# Patient Record
Sex: Male | Born: 2000 | Race: Black or African American | Hispanic: No | Marital: Single
Health system: Southern US, Academic
[De-identification: ages and names within clinical notes are randomized; demographics above are authoritative.]

## PROBLEM LIST (undated history)

## (undated) ENCOUNTER — Emergency Department (HOSPITAL_COMMUNITY): Discharge status: Absent without leave | Payer: MEDICAID

## (undated) ENCOUNTER — Telehealth: Attending: Mental Health | Primary: Mental Health

## (undated) ENCOUNTER — Ambulatory Visit

## (undated) VITALS — BP 117/76 | HR 121 | Temp 98.0°F | Resp 16 | Ht 64.0 in | Wt 160.0 lb

## (undated) DIAGNOSIS — F209 Schizophrenia, unspecified: Secondary | ICD-10-CM

---
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
## 2019-10-19 ENCOUNTER — Emergency Department (HOSPITAL_COMMUNITY)
Admission: EM | Admit: 2019-10-19 | Discharge: 2019-10-20 | Disposition: A | Discharge status: Home / Self Care | Payer: Medicaid Other | Attending: Emergency Medicine | Admitting: Emergency Medicine

## 2019-10-19 ENCOUNTER — Other Ambulatory Visit: Payer: Self-pay

## 2019-10-19 ENCOUNTER — Encounter (HOSPITAL_COMMUNITY): Payer: Self-pay | Admitting: Emergency Medicine

## 2019-10-19 DIAGNOSIS — Z79899 Other long term (current) drug therapy: Secondary | ICD-10-CM | POA: Insufficient documentation

## 2019-10-19 DIAGNOSIS — F339 Major depressive disorder, recurrent, unspecified: Secondary | ICD-10-CM | POA: Insufficient documentation

## 2019-10-19 DIAGNOSIS — F329 Major depressive disorder, single episode, unspecified: Secondary | ICD-10-CM | POA: Diagnosis present

## 2019-10-19 DIAGNOSIS — Z20822 Contact with and (suspected) exposure to covid-19: Secondary | ICD-10-CM | POA: Insufficient documentation

## 2019-10-19 DIAGNOSIS — Z87891 Personal history of nicotine dependence: Secondary | ICD-10-CM | POA: Diagnosis not present

## 2019-10-19 DIAGNOSIS — R45851 Suicidal ideations: Secondary | ICD-10-CM | POA: Insufficient documentation

## 2019-10-19 DIAGNOSIS — F209 Schizophrenia, unspecified: Secondary | ICD-10-CM | POA: Insufficient documentation

## 2019-10-19 LAB — CBC
HCT: 40.6 % (ref 39.0–52.0)
Hemoglobin: 13.8 g/dL (ref 13.0–17.0)
MCH: 32 pg (ref 26.0–34.0)
MCHC: 34 g/dL (ref 30.0–36.0)
MCV: 94.2 fL (ref 80.0–100.0)
Platelets: 224 10*3/uL (ref 150–400)
RBC: 4.31 MIL/uL (ref 4.22–5.81)
RDW: 11.8 % (ref 11.5–15.5)
WBC: 7.5 10*3/uL (ref 4.0–10.5)
nRBC: 0 % (ref 0.0–0.2)

## 2019-10-19 LAB — RAPID URINE DRUG SCREEN, HOSP PERFORMED
Amphetamines: NOT DETECTED
Barbiturates: NOT DETECTED
Benzodiazepines: NOT DETECTED
Cocaine: NOT DETECTED
Opiates: NOT DETECTED
Tetrahydrocannabinol: POSITIVE — AB

## 2019-10-19 LAB — COMPREHENSIVE METABOLIC PANEL
ALT: 13 U/L (ref 0–44)
AST: 15 U/L (ref 15–41)
Albumin: 4.5 g/dL (ref 3.5–5.0)
Alkaline Phosphatase: 69 U/L (ref 38–126)
Anion gap: 10 (ref 5–15)
BUN: 7 mg/dL (ref 6–20)
CO2: 24 mmol/L (ref 22–32)
Calcium: 9.3 mg/dL (ref 8.9–10.3)
Chloride: 105 mmol/L (ref 98–111)
Creatinine, Ser: 1.06 mg/dL (ref 0.61–1.24)
GFR calc Af Amer: >60 mL/min (ref >60)
GFR calc non Af Amer: >60 mL/min (ref >60)
Glucose, Bld: 90 mg/dL (ref 70–99)
Potassium: 3.9 mmol/L (ref 3.5–5.1)
Sodium: 139 mmol/L (ref 135–145)
Total Bilirubin: 0.8 mg/dL (ref 0.3–1.2)
Total Protein: 7 g/dL (ref 6.5–8.1)

## 2019-10-19 LAB — ETHANOL: Alcohol, Ethyl (B): <10 mg/dL (ref <10)

## 2019-10-19 LAB — ACETAMINOPHEN LEVEL: Acetaminophen (Tylenol), Serum: <10 ug/mL — ABNORMAL LOW (ref 10–30)

## 2019-10-19 LAB — SALICYLATE LEVEL: Salicylate Lvl: <7 mg/dL — ABNORMAL LOW (ref 7.0–30.0)

## 2019-10-19 MED ORDER — HYDROXYZINE HCL 50 MG PO TABS
50.0000 mg | ORAL_TABLET | Freq: Four times a day (QID) | ORAL | Status: DC | PRN
  Administered 2019-10-20: 50 mg via ORAL
  Filled 2019-10-19: qty 1

## 2019-10-19 MED ORDER — RISPERIDONE 3 MG PO TABS
3.0000 mg | ORAL_TABLET | Freq: Two times a day (BID) | ORAL | Status: DC
  Administered 2019-10-20: 3 mg via ORAL
  Filled 2019-10-19 (×2): qty 1

## 2019-10-19 MED ORDER — TRAZODONE HCL 50 MG PO TABS
50.0000 mg | ORAL_TABLET | Freq: Every evening | ORAL | Status: DC | PRN
  Administered 2019-10-20: 50 mg via ORAL
  Filled 2019-10-19: qty 1

## 2019-10-19 MED ORDER — BENZTROPINE MESYLATE 1 MG PO TABS
0.5000 mg | ORAL_TABLET | Freq: Two times a day (BID) | ORAL | Status: DC
  Administered 2019-10-20: 01:00:00 0.5 mg via ORAL
  Filled 2019-10-19: qty 1

## 2019-10-19 NOTE — ED Triage Notes (Signed)
Pt brought to ED by [facility] with c/o feeling suicidal.  Pt st's his meds are not helping anymore.  Pt takes Trazodone and Risperdone.

## 2019-10-19 NOTE — ED Provider Notes (Signed)
[facility] EMERGENCY DEPARTMENT Provider Note   CSN: [id number] Arrival date & time: 10/19/19  2006     History Chief Complaint  Patient presents with  . Suicidal    Alec Williams is a 19 y.o. male.  Patient states that he is suicidal.  Patient with history of schizophrenia.  Patient is not having hallucinations now  The history is provided by the patient. No language interpreter was used.  Altered Mental Status Presenting symptoms: behavior changes   Severity:  Severe Most recent episode:  More than 2 days ago Episode history:  Multiple Timing:  Constant Progression:  Worsening Chronicity:  Recurrent Context: drug use   Context: not alcohol use   Associated symptoms: no abdominal pain, no hallucinations, no headaches, no rash and no seizures        Past Medical History:  Diagnosis Date  . Schizophrenia [facility])     Patient Active Problem List   Diagnosis Date Noted  . Self-injurious behavior 12/09/2017  . Major depressive disorder, recurrent, severe with psychotic features ([place]) 12/08/2017    History reviewed. No pertinent surgical history.     No family history on file.  Social History   Tobacco Use  . Smoking status: Former Research scientist (life sciences)  . Smokeless tobacco: Former Systems developer  . Tobacco comment: "I smoke black and mild"  Substance Use Topics  . Alcohol use: No  . Drug use: No    Home Medications Prior to Admission medications   Medication Sig Start Date End Date Taking? Authorizing Provider  risperiDONE (RISPERDAL) 3 MG tablet Take 1 tablet (3 mg total) by mouth 2 (two) times daily. For mood control 07/16/18  Yes Lindell Spar I, NP  benztropine (COGENTIN) 0.5 MG tablet Take 1 tablet (0.5 mg total) by mouth 2 (two) times daily. For prevention of drug induced tremors Patient not taking: Reported on 10/19/2019 07/16/18   Lindell Spar I, NP  hydrOXYzine (ATARAX/VISTARIL) 50 MG tablet Take 1 tablet (50 mg total) by mouth every 6 (six) hours as  needed for anxiety. Patient not taking: Reported on 10/19/2019 07/16/18   Lindell Spar I, NP  traZODone (DESYREL) 50 MG tablet Take 1 tablet (50 mg total) by mouth at bedtime as needed for sleep. Patient not taking: Reported on 10/19/2019 07/16/18   Lindell Spar I, NP    Allergies    Patient has no known allergies.  Review of Systems   Review of Systems  Constitutional: Negative for appetite change and fatigue.  HENT: Negative for congestion, ear discharge and sinus pressure.   Eyes: Negative for discharge.  Respiratory: Negative for cough.   Cardiovascular: Negative for chest pain.  Gastrointestinal: Negative for abdominal pain and diarrhea.  Genitourinary: Negative for frequency and hematuria.  Musculoskeletal: Negative for back pain.  Skin: Negative for rash.  Neurological: Negative for seizures and headaches.  Psychiatric/Behavioral: Positive for suicidal ideas. Negative for hallucinations.    Physical Exam Updated Vital Signs BP 105/67 (BP Location: Left Arm)   Pulse 65   Temp 98.7 F (37.1 C) (Oral)   Resp 16   Ht 5\' 8"  (1.727 m)   Wt 59 kg   SpO2 96%   BMI 19.77 kg/m   Physical Exam Vitals and nursing note reviewed.  Constitutional:      Appearance: He is well-developed.  HENT:     Head: Normocephalic.     Nose: Nose normal.  Eyes:     General: No scleral icterus.    Conjunctiva/sclera: Conjunctivae normal.  Neck:  Thyroid: No thyromegaly.  Cardiovascular:     Rate and Rhythm: Normal rate and regular rhythm.     Heart sounds: No murmur. No friction rub. No gallop.   Pulmonary:     Breath sounds: No stridor. No wheezing or rales.  Chest:     Chest wall: No tenderness.  Abdominal:     General: There is no distension.     Tenderness: There is no abdominal tenderness. There is no rebound.  Musculoskeletal:        General: Normal range of motion.     Cervical back: Neck supple.  Lymphadenopathy:     Cervical: No cervical adenopathy.  Skin:    Findings:  No erythema or rash.  Neurological:     Mental Status: He is alert and oriented to person, place, and time.     Motor: No abnormal muscle tone.     Coordination: Coordination normal.  Psychiatric:     Comments: Suicidal     ED Results / Procedures / Treatments   Labs (all labs ordered are listed, but only abnormal results are displayed) Labs Reviewed  RAPID URINE DRUG SCREEN, HOSP PERFORMED - Abnormal; Notable for the following components:      Result Value   Tetrahydrocannabinol POSITIVE (*)    All other components within normal limits  RESPIRATORY PANEL BY RT PCR (FLU A&B, COVID)  CBC  COMPREHENSIVE METABOLIC PANEL  ETHANOL  SALICYLATE LEVEL  ACETAMINOPHEN LEVEL    EKG None  Radiology No results found.  Procedures Procedures (including critical care time)  Medications Ordered in ED Medications  hydrOXYzine (ATARAX/VISTARIL) tablet 50 mg (has no administration in time range)  benztropine (COGENTIN) tablet 0.5 mg (has no administration in time range)  traZODone (DESYREL) tablet 50 mg (has no administration in time range)  risperiDONE (RISPERDAL) tablet 3 mg (has no administration in time range)    ED Course  I have reviewed the triage vital signs and the nursing notes.  Pertinent labs & imaging results that were available during my care of the patient were reviewed by me and considered in my medical decision making (see chart for details).    MDM Rules/Calculators/A&P                     Patient is suicidal and will be seen by behavioral health Final Clinical Impression(s) / ED Diagnoses Final diagnoses:  None    Rx / DC Orders ED Discharge Orders    None       Bethann Berkshire, MD 10/19/19 2240

## 2019-10-20 ENCOUNTER — Other Ambulatory Visit: Payer: Self-pay

## 2019-10-20 LAB — RESPIRATORY PANEL BY RT PCR (FLU A&B, COVID)
Influenza A by PCR: NEGATIVE
Influenza B by PCR: NEGATIVE
SARS Coronavirus 2 by RT PCR: NEGATIVE

## 2019-10-20 NOTE — ED Notes (Signed)
Pt ate 100% of breakfast. Belongings returned to pt. Pt contacted mother to pick him up for discharge.

## 2019-10-20 NOTE — ED Notes (Signed)
SI/VOL  Breakfast ordered

## 2019-10-20 NOTE — ED Notes (Signed)
TTS in progress 

## 2019-10-20 NOTE — ED Notes (Signed)
Pt to be dc'd home in the am per [place] with [facility].  Pt to follow up with his ACT team and [facility] for medication and counseling.

## 2019-10-20 NOTE — ED Notes (Signed)
Patient denies pain and is resting comfortably.  

## 2019-10-20 NOTE — ED Notes (Signed)
Pt stated mother was on her way to pick him up. Vital signs obtain, discharge paperwork reviewed. Resources for outpatient services included in AVS given to patient. Patient verbalized understanding of discharge instructions, no further questions for staff. Pt was instructed to alert RN when mother is present for pickup and staff will accompany pt out for discharge.
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
## 2019-12-05 ENCOUNTER — Emergency Department (HOSPITAL_COMMUNITY)
Admission: EM | Admit: 2019-12-05 | Discharge: 2019-12-05 | Disposition: A | Discharge status: Home / Self Care | Payer: Medicaid Other | Attending: Emergency Medicine | Admitting: Emergency Medicine

## 2019-12-05 ENCOUNTER — Encounter (HOSPITAL_COMMUNITY): Payer: Self-pay

## 2019-12-05 ENCOUNTER — Other Ambulatory Visit: Payer: Self-pay

## 2019-12-05 DIAGNOSIS — Y939 Activity, unspecified: Secondary | ICD-10-CM | POA: Diagnosis not present

## 2019-12-05 DIAGNOSIS — R519 Headache, unspecified: Secondary | ICD-10-CM | POA: Insufficient documentation

## 2019-12-05 DIAGNOSIS — Y929 Unspecified place or not applicable: Secondary | ICD-10-CM | POA: Insufficient documentation

## 2019-12-05 DIAGNOSIS — R0789 Other chest pain: Secondary | ICD-10-CM | POA: Diagnosis not present

## 2019-12-05 DIAGNOSIS — Z87891 Personal history of nicotine dependence: Secondary | ICD-10-CM | POA: Diagnosis not present

## 2019-12-05 DIAGNOSIS — Y999 Unspecified external cause status: Secondary | ICD-10-CM | POA: Diagnosis not present

## 2019-12-05 DIAGNOSIS — Z79899 Other long term (current) drug therapy: Secondary | ICD-10-CM | POA: Diagnosis not present
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
## 2020-03-01 ENCOUNTER — Encounter (HOSPITAL_COMMUNITY): Payer: Self-pay | Admitting: Emergency Medicine

## 2020-03-01 ENCOUNTER — Emergency Department (HOSPITAL_COMMUNITY)
Admission: EM | Admit: 2020-03-01 | Discharge: 2020-03-01 | Disposition: A | Discharge status: Home / Self Care | Payer: Medicaid Other | Attending: Emergency Medicine | Admitting: Emergency Medicine

## 2020-03-01 DIAGNOSIS — N341 Nonspecific urethritis: Secondary | ICD-10-CM | POA: Insufficient documentation

## 2020-03-01 DIAGNOSIS — H60333 Swimmer's ear, bilateral: Secondary | ICD-10-CM

## 2020-03-01 DIAGNOSIS — R300 Dysuria: Secondary | ICD-10-CM | POA: Diagnosis present

## 2020-03-01 DIAGNOSIS — N342 Other urethritis: Secondary | ICD-10-CM

## 2020-03-01 DIAGNOSIS — Z87891 Personal history of nicotine dependence: Secondary | ICD-10-CM | POA: Insufficient documentation

## 2020-03-01 DIAGNOSIS — F209 Schizophrenia, unspecified: Secondary | ICD-10-CM | POA: Insufficient documentation

## 2020-03-01 LAB — URINALYSIS, ROUTINE W REFLEX MICROSCOPIC
Bilirubin Urine: NEGATIVE
Glucose, UA: NEGATIVE mg/dL
Hgb urine dipstick: NEGATIVE
Ketones, ur: NEGATIVE mg/dL
Leukocytes,Ua: NEGATIVE
Nitrite: NEGATIVE
Protein, ur: NEGATIVE mg/dL
Specific Gravity, Urine: 1.021 (ref 1.005–1.030)
pH: 7 (ref 5.0–8.0)

## 2020-03-01 MED ORDER — NEOMYCIN-POLYMYXIN-HC 3.5-10000-1 OT SUSP: 4.0000 [drp] | Freq: Three times a day (TID) | OTIC | 0 refills | Status: DC

## 2020-03-01 NOTE — ED Provider Notes (Signed)
[facility]-EMERGENCY DEPT Provider Note   CSN: [id number] Arrival date & time: 03/01/20  1731     History Chief Complaint  Patient presents with  . Dysuria    Alec Williams is a 19 y.o. male.  HPI 19 year old male with a history of schizophrenia presents to the ER with complaints of pain with urination and bladder pain.  He states that this has been bothering for the last few weeks.  He called his primary care doctor and states that they were concerned that he might have a UTI.  He has had no difficulty voiding, but states that sometimes his bladder is painful and he has some pain on urination.  No urgency or decreased urine flow.  He has not noticed any blood in his urine.  He does not have any penile discharge.  He states that he sometimes he feels like "his kidneys hurt".  He denies being sexually active.  He also complains of bilateral ear pain, states he went swimming approximately 2 weeks ago and they have been hurting him since.  Denies any fevers, chills, abdominal pain, nausea, vomiting.  No history of kidney stones.    Past Medical History:  Diagnosis Date  . Schizophrenia [facility])     Patient Active Problem List   Diagnosis Date Noted  . Self-injurious behavior 12/09/2017  . 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 No pertinent surgical history.     No family history on file.  Social History   Tobacco Use  . Smoking status: Former Games developer  . Smokeless tobacco: Former Neurosurgeon  . Tobacco comment: "I smoke black and mild"  Vaping Use  . Vaping Use: Every day  Substance Use Topics  . Alcohol use: No  . Drug use: No    Home Medications Prior to Admission medications   Medication Sig Start Date End Date Taking? Authorizing Provider  benztropine (COGENTIN) 0.5 MG tablet Take 1 tablet (0.5 mg total) by mouth 2 (two) times daily. For prevention of drug induced tremors Patient  not taking: Reported on 10/19/2019 07/16/18   Armandina Stammer I, NP  hydrOXYzine (ATARAX/VISTARIL) 50 MG tablet Take 1 tablet (50 mg total) by mouth every 6 (six) hours as needed for anxiety. Patient not taking: Reported on 10/19/2019 07/16/18   Armandina Stammer I, NP  neomycin-polymyxin-hydrocortisone (CORTISPORIN) 3.5-10000-1 OTIC suspension Place 4 drops into both ears 3 (three) times daily. 03/01/20   Mare Ferrari, PA-C  risperiDONE (RISPERDAL) 3 MG tablet Take 1 tablet (3 mg total) by mouth 2 (two) times daily. For mood control 07/16/18   Armandina Stammer I, NP  traZODone (DESYREL) 50 MG tablet Take 1 tablet (50 mg total) by mouth at bedtime as needed for sleep. Patient not taking: Reported on 10/19/2019 07/16/18   Armandina Stammer I, NP    Allergies    Patient has no known allergies.  Review of Systems   Review of Systems  Constitutional: Negative for chills and fever.  HENT: Positive for ear pain. Negative for ear discharge, facial swelling, sore throat, trouble swallowing and voice change.   Gastrointestinal: Negative for abdominal pain, nausea and vomiting.  Genitourinary: Positive for dysuria. Negative for decreased urine volume, discharge, flank pain, penile pain, penile swelling, testicular pain and urgency.    Physical Exam Updated Vital Signs BP 111/79 (BP Location: Right Arm)   Pulse 89   Temp 98.9 F (37.2 C) (Oral)   Resp 19   SpO2 100%  Physical Exam Vitals and nursing note reviewed.  Constitutional:      General: He is not in acute distress.    Appearance: Normal appearance. He is well-developed. He is not ill-appearing, toxic-appearing or diaphoretic.  HENT:     Head: Normocephalic and atraumatic.     Right Ear: Tympanic membrane normal. There is no impacted cerumen.     Left Ear: Tympanic membrane normal. There is no impacted cerumen.     Ears:     Comments: Pain with palpation to the tragus bilaterally.  Mildly inflamed ear canal, TM intact, nonerythematous, nonbulging.     Mouth/Throat:     Mouth: Mucous membranes are moist.     Pharynx: Oropharynx is clear.  Eyes:     Conjunctiva/sclera: Conjunctivae normal.  Cardiovascular:     Rate and Rhythm: Normal rate and regular rhythm.     Pulses: Normal pulses.     Heart sounds: Normal heart sounds. No murmur heard.   Pulmonary:     Effort: Pulmonary effort is normal. No respiratory distress.     Breath sounds: Normal breath sounds.  Abdominal:     Palpations: Abdomen is soft.     Tenderness: There is no abdominal tenderness. There is no right CVA tenderness or left CVA tenderness.     Comments: Abdomen soft and nontender, no flank tenderness bilaterally  Musculoskeletal:        General: No tenderness or deformity. Normal range of motion.     Cervical back: Neck supple.  Skin:    General: Skin is warm and dry.     Findings: No bruising, erythema or rash.  Neurological:     General: No focal deficit present.     Mental Status: He is alert and oriented to person, place, and time.  Psychiatric:        Mood and Affect: Mood normal.        Behavior: Behavior normal.     ED Results / Procedures / Treatments   Labs (all labs ordered are listed, but only abnormal results are displayed) Labs Reviewed  URINALYSIS, ROUTINE W REFLEX MICROSCOPIC - Abnormal; Notable for the following components:      Result Value   APPearance CLOUDY (*)    All other components within normal limits  URINE CULTURE  GC/CHLAMYDIA PROBE AMP ([place]) NOT AT [facility]    EKG None  Radiology No results found.  Procedures Procedures (including critical care time)  Medications Ordered in ED Medications - No data to display  ED Course  I have reviewed the triage vital signs and the nursing notes.  Pertinent labs & imaging results that were available during my care of the patient were reviewed by me and considered in my medical decision making (see chart for details).    MDM Rules/Calculators/A&P                           19 year old with painful urination and pain in the bladder occasionally.  He also complains of bilateral ear pain.  Physical exam without abdominal tenderness, flank tenderness.  Ear exam with pain with palpation to the tragus bilaterally, mildly erythematous ear canal, but nonerythematous TM, which is still intact.  History of diabetes, no headaches, no purulent otorrhea, no dysphagia, hoarseness or facial nerve dysfunction.  Suspect otitis externa. Normal outer ear without swelling, doubt malignant otitis externa.  UA without evidence of UTI, no blood.  Given benign physical exam, and no blood in  the urine, doubt kidney stones.  No evidence of UTI.  Will add on GC chlamydia but patient states that he is not sexually active.  Culture pending.  In terms of his ear pain, will treat with polymyxin drops.  Patient states that he does not have a PCP, encouraged him to call the phone number on the discharge paperwork in order to establish with  One and follow up.  Also referred him to urology if his symptoms do not improve.  Patient is overall reassured, he voices understanding and is agreeable.  Return precautions given.  At this stage in the ED course, the patient is medically screened and is stable for discharge.   Discussed the case with Dr. Juleen China who is agreable with the above disposition and plan. Final Clinical Impression(s) / ED Diagnoses Final diagnoses:  Acute swimmer's ear of both sides  Urethritis    Rx / DC Orders ED Discharge Orders         Ordered    neomycin-polymyxin-hydrocortisone (CORTISPORIN) 3.5-10000-1 OTIC suspension  3 times daily     Discontinue  Reprint     03/01/20 1939           Leone Brand 03/01/20 1956    Raeford Razor, MD 03/03/20 2306

## 2020-03-01 NOTE — Discharge Instructions (Addendum)
Please make sure to follow-up with a primary care provider.  If you do not have 1, please call the number on your discharge paperwork in order to establish with 1.  I have also provided the phone number for a urologist who is a bladder doctor, he may call them to schedule an appointment if your symptoms not improve.  Your STD results will be visible via the MyChart app which you can download.  If this is positive, you will receive a phone call from the health department for treatment.  I have prescribed an antibiotic for your ears, please take them as directed.  Return to the ER if your symptoms worsen.

## 2020-03-01 NOTE — ED Triage Notes (Signed)
Patient here from home via EMS reporting UTI. Reports PCP called and notified him. Reports burning when urinating.

## 2020-03-02 LAB — GC/CHLAMYDIA PROBE AMP (~~LOC~~) NOT AT ARMC
Chlamydia: NEGATIVE
Comment: NEGATIVE
Comment: NORMAL
Neisseria Gonorrhea: NEGATIVE

## 2020-03-03 LAB — URINE CULTURE: Culture: NO GROWTH

## 2020-03-08 ENCOUNTER — Encounter (HOSPITAL_COMMUNITY): Payer: Self-pay

## 2020-03-08 ENCOUNTER — Emergency Department (HOSPITAL_COMMUNITY): Payer: Medicaid Other

## 2020-03-08 ENCOUNTER — Other Ambulatory Visit: Payer: Self-pay

## 2020-03-08 ENCOUNTER — Emergency Department (HOSPITAL_COMMUNITY)
Admission: EM | Admit: 2020-03-08 | Discharge: 2020-03-08 | Disposition: A | Discharge status: Home / Self Care | Payer: Medicaid Other | Attending: Emergency Medicine | Admitting: Emergency Medicine

## 2020-03-08 DIAGNOSIS — S0083XA Contusion of other part of head, initial encounter: Secondary | ICD-10-CM | POA: Diagnosis not present

## 2020-03-08 DIAGNOSIS — S0990XA Unspecified injury of head, initial encounter: Secondary | ICD-10-CM | POA: Insufficient documentation

## 2020-03-08 DIAGNOSIS — Z87891 Personal history of nicotine dependence: Secondary | ICD-10-CM | POA: Insufficient documentation

## 2020-03-08 DIAGNOSIS — X58XXXA Exposure to other specified factors, initial encounter: Secondary | ICD-10-CM | POA: Insufficient documentation

## 2020-03-08 DIAGNOSIS — Y9289 Other specified places as the place of occurrence of the external cause: Secondary | ICD-10-CM | POA: Diagnosis not present

## 2020-03-08 DIAGNOSIS — Y998 Other external cause status: Secondary | ICD-10-CM | POA: Diagnosis not present

## 2020-03-08 DIAGNOSIS — Y9389 Activity, other specified: Secondary | ICD-10-CM | POA: Insufficient documentation

## 2020-03-08 DIAGNOSIS — S0450XA Injury of facial nerve, unspecified side, initial encounter: Secondary | ICD-10-CM | POA: Diagnosis present

## 2020-03-08 NOTE — ED Notes (Signed)
Pt not visualized in room.
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
## 2022-01-09 ENCOUNTER — Ambulatory Visit
Admit: 2022-01-09 | Discharge: 2022-01-10 | Disposition: A | Discharge status: Other Acute Inpatient Hospital | Payer: MEDICAID

## 2022-01-09 DIAGNOSIS — T7421XA Adult sexual abuse, confirmed, initial encounter: Principal | ICD-10-CM

## 2022-01-09 MED ORDER — EMTRICITABINE 200 MG-TENOFOVIR DISOPROXIL FUMARATE 300 MG TABLET
ORAL_TABLET | Freq: Every day | ORAL | 0 refills | 25 days | Status: CP
Start: 2022-01-09 — End: 2022-02-03

## 2022-01-09 MED ORDER — RALTEGRAVIR 400 MG TABLET
ORAL_TABLET | Freq: Two times a day (BID) | ORAL | 0 refills | 25 days | Status: CP
Start: 2022-01-09 — End: 2022-02-03

## 2022-01-10 NOTE — Unmapped (Signed)
[facility] Specialty Medication Onboarding    Specialty Medication: emtricitabine-tenofovir (TDF) 200-300 mg per tablet (TRUVADA)  Prior Authorization: Not Required   Financial Assistance: No - copay  <$25  Final Copay/Day Supply: $4 / 30    Insurance Restrictions: None     Notes to Pharmacist:     The triage team has completed the benefits investigation and has determined that the patient is able to fill this medication at [facility]. Please contact the patient to complete the onboarding or follow up with the prescribing physician as needed.    [facility] Specialty Medication Onboarding    Specialty Medication: ISENTRESS 400 mg tablet (raltegravir)  Prior Authorization: Not Required   Financial Assistance: No - copay  <$25  Final Copay/Day Supply: $4 / 30    Insurance Restrictions: None     Notes to Pharmacist:     The triage team has completed the benefits investigation and has determined that the patient is able to fill this medication at [facility]. Please contact the patient to complete the onboarding or follow up with the prescribing physician as needed.
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
## 2022-01-13 LAB — HIV ANTIGEN/ANTIBODY COMBO: HIV ANTIGEN/ANTIBODY COMBO: NONREACTIVE

## 2022-01-13 LAB — HEPATITIS B SURFACE ANTIBODY
HEPATITIS B SURFACE ANTIBODY QUANT: <8 m[IU]/mL (ref <8.00)
HEPATITIS B SURFACE ANTIBODY: NONREACTIVE

## 2022-01-13 LAB — HEPATITIS C ANTIBODY: HEPATITIS C ANTIBODY: NONREACTIVE

## 2022-01-13 LAB — HEPATITIS B SURFACE ANTIGEN: HEPATITIS B SURFACE ANTIGEN: NONREACTIVE

## 2022-06-24 ENCOUNTER — Encounter (HOSPITAL_COMMUNITY): Payer: Self-pay | Admitting: Psychiatry

## 2022-06-24 ENCOUNTER — Other Ambulatory Visit: Payer: Self-pay

## 2022-06-24 ENCOUNTER — Inpatient Hospital Stay (HOSPITAL_COMMUNITY)
Admission: AD | Admit: 2022-06-24 | Discharge: 2022-07-02 | DRG: 885 | Disposition: A | Discharge status: Home / Self Care | Payer: Medicaid Other | Attending: Psychiatry | Admitting: Psychiatry

## 2022-06-24 DIAGNOSIS — Z9152 Personal history of nonsuicidal self-harm: Secondary | ICD-10-CM | POA: Diagnosis not present

## 2022-06-24 DIAGNOSIS — G4700 Insomnia, unspecified: Secondary | ICD-10-CM | POA: Diagnosis present

## 2022-06-24 DIAGNOSIS — Z79899 Other long term (current) drug therapy: Secondary | ICD-10-CM

## 2022-06-24 DIAGNOSIS — F259 Schizoaffective disorder, unspecified: Secondary | ICD-10-CM | POA: Diagnosis present

## 2022-06-24 DIAGNOSIS — F333 Major depressive disorder, recurrent, severe with psychotic symptoms: Principal | ICD-10-CM

## 2022-06-24 DIAGNOSIS — Z20822 Contact with and (suspected) exposure to covid-19: Secondary | ICD-10-CM | POA: Diagnosis present

## 2022-06-24 DIAGNOSIS — F909 Attention-deficit hyperactivity disorder, unspecified type: Secondary | ICD-10-CM | POA: Diagnosis present

## 2022-06-24 DIAGNOSIS — Z7289 Other problems related to lifestyle: Secondary | ICD-10-CM

## 2022-06-24 DIAGNOSIS — F17210 Nicotine dependence, cigarettes, uncomplicated: Secondary | ICD-10-CM | POA: Diagnosis present

## 2022-06-24 DIAGNOSIS — F064 Anxiety disorder due to known physiological condition: Secondary | ICD-10-CM | POA: Diagnosis present

## 2022-06-24 DIAGNOSIS — Z23 Encounter for immunization: Secondary | ICD-10-CM | POA: Diagnosis not present

## 2022-06-24 DIAGNOSIS — Z818 Family history of other mental and behavioral disorders: Secondary | ICD-10-CM | POA: Diagnosis not present

## 2022-06-24 DIAGNOSIS — F250 Schizoaffective disorder, bipolar type: Secondary | ICD-10-CM | POA: Diagnosis not present

## 2022-06-24 LAB — SARS CORONAVIRUS 2 BY RT PCR: SARS Coronavirus 2 by RT PCR: NEGATIVE

## 2022-06-24 MED ORDER — ACETAMINOPHEN 325 MG PO TABS
650.0000 mg | ORAL_TABLET | Freq: Four times a day (QID) | ORAL | Status: DC | PRN
  Administered 2022-06-26 – 2022-07-02 (×7): 650 mg via ORAL
  Filled 2022-06-24 (×7): qty 2

## 2022-06-24 MED ORDER — TRAZODONE HCL 50 MG PO TABS
50.0000 mg | ORAL_TABLET | Freq: Every evening | ORAL | Status: DC | PRN
  Administered 2022-06-24 – 2022-06-28 (×5): 50 mg via ORAL
  Filled 2022-06-24 (×5): qty 1

## 2022-06-24 MED ORDER — ALUM & MAG HYDROXIDE-SIMETH 200-200-20 MG/5ML PO SUSP: 30.0000 mL | ORAL | Status: DC | PRN

## 2022-06-24 MED ORDER — MAGNESIUM HYDROXIDE 400 MG/5ML PO SUSP
30.0000 mL | Freq: Every day | ORAL | Status: DC | PRN
  Administered 2022-06-28 – 2022-06-30 (×3): 30 mL via ORAL
  Filled 2022-06-24 (×3): qty 30

## 2022-06-24 MED ORDER — INFLUENZA VAC SPLIT QUAD 0.5 ML IM SUSY
0.5000 mL | PREFILLED_SYRINGE | INTRAMUSCULAR | Status: AC
  Administered 2022-06-25: 0.5 mL via INTRAMUSCULAR
  Filled 2022-06-24: qty 0.5

## 2022-06-24 MED ORDER — HYDROXYZINE HCL 25 MG PO TABS
25.0000 mg | ORAL_TABLET | Freq: Three times a day (TID) | ORAL | Status: DC | PRN
  Administered 2022-06-24 – 2022-07-01 (×9): 25 mg via ORAL
  Filled 2022-06-24 (×9): qty 1
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
## 2022-06-24 NOTE — Progress Notes (Addendum)
   06/24/22 2200  Psych Admission Type (Psych Patients Only)  Admission Status Voluntary  Psychosocial Assessment  Patient Complaints Anxiety  Eye Contact Intense  Facial Expression Fixed smile  Affect Euphoric  Speech Pressured  Interaction Attention-seeking  Motor Activity Hyperactive  Appearance/Hygiene Layered clothes  Behavior Characteristics Appropriate to situation  Mood Elated  Thought Process  Coherency Tangential  Content Magical thinking  Delusions Referential  Perception WDL  Hallucination None reported or observed  Judgment Impaired  Confusion None  Danger to Self  Current suicidal ideation? Denies  Danger to Others  Danger to Others None reported or observed   Pt has been calm and cooperative, pt has poor boundary and requires redirections. Pt given Trazodone and vistaril before bed, pt in bed at this time asleep, will continue to monitor.

## 2022-06-24 NOTE — Plan of Care (Signed)
Care Plan initiated with patient

## 2022-06-24 NOTE — Tx Team (Signed)
Initial Treatment Plan 06/24/2022 7:27 PM [name] [id number]    PATIENT STRESSORS: Legal issue     PATIENT STRENGTHS: Electronics engineer    PATIENT IDENTIFIED PROBLEMS: I have to go to court for stalking                     DISCHARGE CRITERIA:  Improved stabilization in mood, thinking, and/or behavior  PRELIMINARY DISCHARGE PLAN: Return to previous living arrangement  PATIENT/FAMILY INVOLVEMENT: This treatment plan has been presented to and reviewed with the patient, Alec Williams, and/or family member, .  The patient and family have been given the opportunity to ask questions and make suggestions.  Clarita Crane, RN 06/24/2022, 7:27 PM

## 2022-06-24 NOTE — [facility] Group Notes (Signed)
Adult Psychoeducational Group Note  Date:  06/24/2022 Time:  8:30 PM  Group Topic/Focus:  Wrap-Up Group:   The focus of this group is to help patients review their daily goal of treatment and discuss progress on daily workbooks.  Participation Level:  Active  Participation Quality:  Attentive  Affect:  Appropriate  Cognitive:  Alert  Insight: Appropriate  Engagement in Group:  Engaged  Modes of Intervention:  Discussion  Additional Comments:  Patient attended and participated in the [place] group.  Annie Sable 06/24/2022, 8:30 PM

## 2022-06-25 LAB — URINALYSIS, COMPLETE (UACMP) WITH MICROSCOPIC
Bacteria, UA: NONE SEEN
Bilirubin Urine: NEGATIVE
Glucose, UA: NEGATIVE mg/dL
Hgb urine dipstick: NEGATIVE
Ketones, ur: NEGATIVE mg/dL
Leukocytes,Ua: NEGATIVE
Nitrite: NEGATIVE
Protein, ur: NEGATIVE mg/dL
Specific Gravity, Urine: 1.01 (ref 1.005–1.030)
pH: 7 (ref 5.0–8.0)

## 2022-06-25 LAB — RAPID URINE DRUG SCREEN, HOSP PERFORMED
Amphetamines: NOT DETECTED
Barbiturates: NOT DETECTED
Benzodiazepines: NOT DETECTED
Cocaine: NOT DETECTED
Opiates: NOT DETECTED
Tetrahydrocannabinol: NOT DETECTED

## 2022-06-25 LAB — COMPREHENSIVE METABOLIC PANEL
ALT: 29 U/L (ref 0–44)
AST: 16 U/L (ref 15–41)
Albumin: 4 g/dL (ref 3.5–5.0)
Alkaline Phosphatase: 61 U/L (ref 38–126)
Anion gap: 7 (ref 5–15)
BUN: 8 mg/dL (ref 6–20)
CO2: 26 mmol/L (ref 22–32)
Calcium: 9.2 mg/dL (ref 8.9–10.3)
Chloride: 105 mmol/L (ref 98–111)
Creatinine, Ser: 0.74 mg/dL (ref 0.61–1.24)
GFR, Estimated: >60 mL/min (ref >60)
Glucose, Bld: 82 mg/dL (ref 70–99)
Potassium: 3.6 mmol/L (ref 3.5–5.1)
Sodium: 138 mmol/L (ref 135–145)
Total Bilirubin: 0.8 mg/dL (ref 0.3–1.2)
Total Protein: 6.8 g/dL (ref 6.5–8.1)

## 2022-06-25 LAB — CBC
HCT: 43.2 % (ref 39.0–52.0)
Hemoglobin: 14.5 g/dL (ref 13.0–17.0)
MCH: 32.3 pg (ref 26.0–34.0)
MCHC: 33.6 g/dL (ref 30.0–36.0)
MCV: 96.2 fL (ref 80.0–100.0)
Platelets: 290 10*3/uL (ref 150–400)
RBC: 4.49 MIL/uL (ref 4.22–5.81)
RDW: 12 % (ref 11.5–15.5)
WBC: 7.1 10*3/uL (ref 4.0–10.5)
nRBC: 0 % (ref 0.0–0.2)

## 2022-06-25 LAB — HEMOGLOBIN A1C
Hgb A1c MFr Bld: 4.7 % — ABNORMAL LOW (ref 4.8–5.6)
Mean Plasma Glucose: 88.19 mg/dL

## 2022-06-25 LAB — LIPID PANEL
Cholesterol: 147 mg/dL (ref 0–200)
HDL: 43 mg/dL (ref >40)
LDL Cholesterol: 90 mg/dL (ref 0–99)
Total CHOL/HDL Ratio: 3.4 RATIO
Triglycerides: 70 mg/dL (ref <150)
VLDL: 14 mg/dL (ref 0–40)

## 2022-06-25 LAB — TSH: TSH: 1.312 u[IU]/mL (ref 0.350–4.500)

## 2022-06-25 MED ORDER — RISPERIDONE 2 MG PO TBDP
2.0000 mg | ORAL_TABLET | Freq: Three times a day (TID) | ORAL | Status: DC | PRN
  Administered 2022-06-26 – 2022-06-27 (×2): 2 mg via ORAL
  Filled 2022-06-25 (×2): qty 1

## 2022-06-25 MED ORDER — NICOTINE POLACRILEX 2 MG MT GUM
2.0000 mg | CHEWING_GUM | OROMUCOSAL | Status: DC | PRN
  Administered 2022-06-25 – 2022-07-02 (×12): 2 mg via ORAL
  Filled 2022-06-25 (×9): qty 1

## 2022-06-25 MED ORDER — ZIPRASIDONE MESYLATE 20 MG IM SOLR: 20.0000 mg | Freq: Four times a day (QID) | INTRAMUSCULAR | Status: DC | PRN

## 2022-06-25 MED ORDER — RISPERIDONE 1 MG PO TABS
1.0000 mg | ORAL_TABLET | Freq: Two times a day (BID) | ORAL | Status: DC
  Administered 2022-06-25 – 2022-06-28 (×6): 1 mg via ORAL
  Filled 2022-06-25 (×8): qty 1

## 2022-06-25 MED ORDER — BENZTROPINE MESYLATE 0.5 MG PO TABS
0.5000 mg | ORAL_TABLET | Freq: Two times a day (BID) | ORAL | Status: DC
  Administered 2022-06-25 – 2022-07-02 (×14): 0.5 mg via ORAL
  Filled 2022-06-25 (×18): qty 1

## 2022-06-25 MED ORDER — LORAZEPAM 1 MG PO TABS: 1.0000 mg | ORAL_TABLET | Freq: Four times a day (QID) | ORAL | Status: DC | PRN

## 2022-06-25 NOTE — H&P (Signed)
Psychiatric Admission Assessment Adult  Patient Identification: Alec Williams MRN:  [id number] Date of Evaluation:  06/25/2022 Chief Complaint:  MDD (major depressive disorder), recurrent, severe, with psychosis (HCC) [F33.3] Principal Diagnosis: MDD (major depressive disorder), recurrent, severe, with psychosis (HCC) Diagnosis:  Principal Problem:   MDD (major depressive disorder), recurrent, severe, with psychosis (HCC)   History of Present Illness:  Alec Williams is a 21 year old, African-American male with a past psychiatric history significant for major depressive disorder (recurrent severe, with psychotic features) and self-injurious behavior who voluntarily presented to [facility] for evaluation due to reported 6 months of pregnancy and vomiting.  It was previously seen at this facility on April 2019 and October 2019.  During the assessment, patient is extremely delusional and was constantly being redirected.  Patient's history may be unreliable due to patient's mental state.  Patient reports that he is unsure of why he was admitted to [facility] but states that he was having flulike symptoms prior to his admission to [facility].  Patient states that he believes that he is here to have his flu shot and as soon as he gets his flu shot, he will be discharged.  He reports that he is unsure of what his mother put on his forms before being admitted to this facility.  Patient denies experiencing any bizarre behaviors but states that he was having bad thoughts and was texting things to his cousin prior to admission.  Patient reports that he was experiencing anxiousness due to believing that he cannot get flu shots.  He also reports anxiety due to believing that his mom would not accept him back home.  He reports that he feels that his family does not love him.  Patient also added that he experiences anxiety because of being on a lot of pills.  He states that he hopes this is the  last time he is here at this hospital and hopes to be discharged today.  Patient endorses excessive worrying and social anxiety.  He denies panic attack but states that he had a panic attack when in jail.    Patient denies experiencing depression but then reported that he was having depression due to being in prison and having issues with his family.  Patient patient endorses the following depressive symptoms: Insomnia, hypersomnia, lack of interest in activities or hobbies, depressed mood, feelings of guilt/worthlessness, decreased energy, decreased concentration, self-isolation, decreased appetite, irritability, memory issues, crease libido.  He denied suicidal ideations.  Patient added that he has been barely able to sleep since being discharged from jail but while he was in jail, patient states that he slept for 23 hours a day.  Patient reports that he was recently released from prison after serving 25 months.  He then explained that he was released from prison after serving 17 months for stalking someone.  Patient also endorses distractibility, elevated mood, grandiosity, racing thoughts, irritability, mood swings, pressured speech, and sleep deficit.  Patient reported being in a good mood by giving a thumbs up.  He denies current depression and states that he is unsure of anxiety at this time.  He denied suicidal or homicidal ideations but then later stated that he was not sure of homicidal ideations.  He denied auditory or visual hallucinations but states that prior to his admission to the facility, he was hearing demons and seeing the devil and zombies.  Patient endorses paranoia stating that he is unsure if he will be able to return back home stating that  his mother said he needed to go to a shelter.  Patient denies traumatic experiences but states that he has flashbacks of himself doing witchcraft with his mother.  He reports that his mother has since stopped witchcraft.  Patient reports that he has  nightmares that consists gobs, demons, and witchcraft. During the assessment, patient is extremely delusional and was constantly being redirected.  Patient's history may be unreliable due to patient's current mental state.  Past Psychiatric Hx: Patient denies having a past psychiatric history.  Per patient's previous [facility] admission (07/07/2018), patient had a history of self-injurious behavior and major depressive disorder  Substance use history:  When asked about substance abuse, patient replied with "I can't remember."  Past psychiatric medication history: Patient has been on the following psychiatric medications:  Risperdal 3 mg 2 times daily Benztropine 0.5 mg 2 times daily Trazodone 50 mg at bedtime Hydroxyzine 50 mg every 6 hours needed  Patient was discharged on these medications during his last admission to [facility] on 07/16/2018)  Family history:  When asked about a family history of psychiatric illness, patient replied "I can't remember."  Past Medical History: Patient has a past history of major depressive disorder, recurrent, severe psychosis  Prior Surgeries: Patient replied "I can't remember" Head trauma, LOC, concussions, seizures: not noted Allergies: Patient PCP: Patient stated "I can't remember." Mental Health Provider: Patient reports that he has a psychiatrist at [facility] but then stated "I don't remember." Therapist: Patient stated "I can't remember."  Additional Social History: Patient denies employment and states that he receives SSI. Patient has a past history of being in jail for 17 - 25 months. He reports that he was sent to jail over a stalking charge. Patient reports that he was admitted to [facility] for 9 months for psychiatric evaluation. Patient endorses social support although in his history, he states that he feels that his family does not love him.  Current Presentation:  Patient is delusional during the bulk of the encounter. Patient ia  able to maintain fair eye contact with the provider. Patient speech is fluent and coherent with normal rate. He presents with euthymic mood with congruent affect. Patient's thought process is disorganized and he exhibit delusional thought content.  Medication plan: Patient to be started on Risperdal 1 mg two times daily. Risperdal to be increased to 1.5 mg two times daily. Patient to also be started on cogentin 0.5 mg 2 times daily.  Associated Signs/Symptoms: Depression Symptoms:  depressed mood, anhedonia, insomnia, hypersomnia, psychomotor agitation, fatigue, feelings of worthlessness/guilt, difficulty concentrating, hopelessness, impaired memory, loss of energy/fatigue, disturbed sleep, decreased labido, decreased appetite, Duration of Depression Symptoms: No data recorded (Hypo) Manic Symptoms:  Delusions, Distractibility, Elevated Mood, Flight of Ideas, Grandiosity, Irritable Mood, Labiality of Mood, Anxiety Symptoms:  Excessive Worry, Social Anxiety, Psychotic Symptoms:  Delusions, Hallucinations: Auditory Visual PTSD Symptoms: Re-experiencing:  Flashbacks Nightmares Total Time spent with patient: 45 minutes  Past Psychiatric History:  Per patient's previous [facility] admission (07/07/2018), patient had a history of self-injurious behavior and major depressive disorder  Is the patient at risk to self? No.  Has the patient been a risk to self in the past 6 months? No.  Has the patient been a risk to self within the distant past? No.  Is the patient a risk to others? No.  Has the patient been a risk to others in the past 6 months? No.  Has the patient been a risk to others within the distant past? No.  [place] Scale:  Flowsheet Row Admission (Current) [facility] Ad[facility] (Dis[facility]  C-SSRS RISK CATEGORY No Risk No Risk No Risk        Prior Inpatient Therapy:   Prior Outpatient Therapy:    Alcohol Screening: Patient refused Alcohol Screening Tool: Yes (no) 1. How often do you have a drink containing alcohol?: Monthly or less 2. How many drinks containing alcohol do you have on a typical day when you are drinking?: 1 or 2 3. How often do you have six or more drinks on one occasion?: Never AUDIT-C Score: 1 Alcohol Brief Interventions/Follow-up: Alcohol education/Brief advice Substance Abuse History in the last 12 months:  Not noted Consequences of Substance Abuse: Not noted Previous Psychotropic Medications: Yes  Psychological Evaluations: Yes  Past Medical History:  Past Medical History:  Diagnosis Date   Schizophrenia (HCC)    History reviewed. No pertinent surgical history. Family History: History reviewed. No pertinent family history. Family Psychiatric  History: not noted Tobacco Screening:  Social History:  Social History   Substance and Sexual Activity  Alcohol Use No     Social History   Substance and Sexual Activity  Drug Use No    Additional Social History:  Allergies:  No Known Allergies Lab Results:  Results for orders placed or performed during the hospital encounter of 06/24/22 (from the past 48 hour(s))  SARS Coronavirus 2 by RT PCR (hospital order, performed in [facility]) *cepheid single result test* Anterior Nasal Swab     Status: None   Collection Time: 06/24/22  1:01 PM   Specimen: Anterior Nasal Swab  Result Value Ref Range   SARS Coronavirus 2 by RT PCR NEGATIVE NEGATIVE    Comment: (NOTE) SARS-CoV-2 target nucleic acids are NOT DETECTED.  The SARS-CoV-2 RNA is generally detectable in upper and lower respiratory specimens during the acute phase of infection. The lowest concentration of SARS-CoV-2 viral copies this assay can detect is 250 copies / mL. A negative result does not preclude  SARS-CoV-2 infection and should not be used as the sole basis for treatment or other patient management decisions.  A negative result may occur with improper specimen collection / handling, submission of specimen other than nasopharyngeal swab, presence of viral mutation(s) within the areas targeted by this assay, and inadequate number of viral copies (<250 copies / mL). A negative result must be combined with clinical observations, patient history, and epidemiological information.  Fact Sheet for Patients:   RoadLapTop.co.zahttps://www.fda.gov/media/158405/download  Fact Sheet for Healthcare Providers: http://kim-miller.com/https://www.fda.gov/media/158404/download  This test is not yet approved or  cleared by the [place] FDA and has been authorized for detection and/or diagnosis of SARS-CoV-2 by FDA under an Emergency Use Authorization (EUA).  This EUA will remain in effect (meaning this test can be used) for the duration of the COVID-19 declaration under Section 564(b)(1) of the Act, 21 U.S.C. section 360bbb-3(b)(1), unless the authorization is terminated or revoked sooner.  Performed at [facility], [address] [place], [place] [address]   CBC     Status: None   Collection Time: 06/25/22  6:40 AM  Result Value Ref Range   WBC 7.1 4.0 - 10.5 K/uL   RBC 4.49 4.22 - 5.81 MIL/uL   Hemoglobin 14.5 13.0 - 17.0 g/dL   HCT 60.443.2 54.039.0 - 98.152.0 %  MCV 96.2 80.0 - 100.0 fL   MCH 32.3 26.0 - 34.0 pg   MCHC 33.6 30.0 - 36.0 g/dL   RDW 81.1 91.4 - 78.2 %   Platelets 290 150 - 400 K/uL   nRBC 0.0 0.0 - 0.2 %    Comment: Performed at [facility], [address], [place], [place] [address]  Comprehensive metabolic panel     Status: None   Collection Time: 06/25/22  6:40 AM  Result Value Ref Range   Sodium 138 135 - 145 mmol/L   Potassium 3.6 3.5 - 5.1 mmol/L   Chloride 105 98 - 111 mmol/L   CO2 26 22 - 32 mmol/L   Glucose, Bld 82 70 - 99 mg/dL    Comment: Glucose  reference range applies only to samples taken after fasting for at least 8 hours.   BUN 8 6 - 20 mg/dL   Creatinine, Ser 3.08 0.61 - 1.24 mg/dL   Calcium 9.2 8.9 - 65.7 mg/dL   Total Protein 6.8 6.5 - 8.1 g/dL   Albumin 4.0 3.5 - 5.0 g/dL   AST 16 15 - 41 U/L   ALT 29 0 - 44 U/L   Alkaline Phosphatase 61 38 - 126 U/L   Total Bilirubin 0.8 0.3 - 1.2 mg/dL   GFR, Estimated >84 >69 mL/min    Comment: (NOTE) Calculated using the CKD-EPI Creatinine Equation (2021)    Anion gap 7 5 - 15    Comment: Performed at [facility], [address] [place], [place] [address]  Lipid panel     Status: None   Collection Time: 06/25/22  6:40 AM  Result Value Ref Range   Cholesterol 147 0 - 200 mg/dL   Triglycerides 70 <841 mg/dL   HDL 43 >32 mg/dL   Total CHOL/HDL Ratio 3.4 RATIO   VLDL 14 0 - 40 mg/dL   LDL Cholesterol 90 0 - 99 mg/dL    Comment:        Total Cholesterol/HDL:CHD Risk Coronary Heart Disease Risk Table                     Men   Women  1/2 Average Risk   3.4   3.3  Average Risk       5.0   4.4  2 X Average Risk   9.6   7.1  3 X Average Risk  23.4   11.0        Use the calculated Patient Ratio above and the CHD Risk Table to determine the patient's CHD Risk.        ATP III CLASSIFICATION (LDL):  <100     mg/dL   Optimal  440-102  mg/dL   Near or Above                    Optimal  130-159  mg/dL   Borderline  725-366  mg/dL   High  >440     mg/dL   Very High Performed at [facility], [address] [place], [place] [address]   TSH     Status: None   Collection Time: 06/25/22  6:40 AM  Result Value Ref Range   TSH 1.312 0.350 - 4.500 uIU/mL    Comment: Performed by a 3rd Generation assay with a functional sensitivity of <=0.01 uIU/mL. Performed at [facility], [address] [place], [place] [address]   Hemoglobin A1c  Status: Abnormal   Collection Time: 06/25/22  6:58 AM  Result Value Ref Range   Hgb  A1c MFr Bld 4.7 (L) 4.8 - 5.6 %    Comment: (NOTE) Pre diabetes:          5.7%-6.4%  Diabetes:              >6.4%  Glycemic control for   <7.0% adults with diabetes    Mean Plasma Glucose 88.19 mg/dL    Comment: Performed at [facility], [address], [place], [place] [address]    Blood Alcohol level:  Lab Results  Component Value Date   [facility] <10 10/19/2019   ETH <10 07/07/2018    Metabolic Disorder Labs:  Lab Results  Component Value Date   HGBA1C 4.7 (L) 06/25/2022   MPG 88.19 06/25/2022   MPG 85.32 07/09/2018   Lab Results  Component Value Date   PROLACTIN 22.9 (H) 07/09/2018   PROLACTIN 10.6 12/10/2017   Lab Results  Component Value Date   CHOL 147 06/25/2022   TRIG 70 06/25/2022   HDL 43 06/25/2022   CHOLHDL 3.4 06/25/2022   VLDL 14 06/25/2022   LDLCALC 90 06/25/2022   LDLCALC 79 07/09/2018    Current Medications: Current Facility-Administered Medications  Medication Dose Route Frequency Provider Last Rate Last Admin   acetaminophen (TYLENOL) tablet 650 mg  650 mg Oral Q6H PRN Ntuen, Jesusita Oka, FNP       alum & mag hydroxide-simeth (MAALOX/MYLANTA) 200-200-20 MG/5ML suspension 30 mL  30 mL Oral Q4H PRN Ntuen, Jesusita Oka, FNP       benztropine (COGENTIN) tablet 0.5 mg  0.5 mg Oral BID Jeralynn Vaquera E, PA       hydrOXYzine (ATARAX) tablet 25 mg  25 mg Oral TID PRN Cecilie Lowers, FNP   25 mg at 06/25/22 1130   risperiDONE (RISPERDAL M-TABS) disintegrating tablet 2 mg  2 mg Oral Q8H PRN Massengill, Harrold Donath, MD       And   LORazepam (ATIVAN) tablet 1 mg  1 mg Oral Q6H PRN Massengill, Harrold Donath, MD       And   ziprasidone (GEODON) injection 20 mg  20 mg Intramuscular Q6H PRN Massengill, Nathan, MD       magnesium hydroxide (MILK OF MAGNESIA) suspension 30 mL  30 mL Oral Daily PRN Ntuen, Jesusita Oka, FNP       nicotine polacrilex (NICORETTE) gum 2 mg  2 mg Oral PRN Massengill, Harrold Donath, MD   2 mg at 06/25/22 1129   risperiDONE (RISPERDAL) tablet 1 mg  1 mg Oral BID  Misheel Gowans E, PA       traZODone (DESYREL) tablet 50 mg  50 mg Oral QHS PRN Ntuen, Jesusita Oka, FNP   50 mg at 06/24/22 2037   PTA Medications: Medications Prior to Admission  Medication Sig Dispense Refill Last Dose   benztropine (COGENTIN) 0.5 MG tablet Take 1 tablet (0.5 mg total) by mouth 2 (two) times daily. For prevention of drug induced tremors (Patient not taking: Reported on 10/19/2019) 60 tablet 0    hydrOXYzine (ATARAX/VISTARIL) 50 MG tablet Take 1 tablet (50 mg total) by mouth every 6 (six) hours as needed for anxiety. (Patient not taking: Reported on 10/19/2019) 60 tablet 0    risperiDONE (RISPERDAL) 3 MG tablet Take 1 tablet (3 mg total) by mouth 2 (two) times daily. For mood control 30 tablet 0    traZODone (DESYREL) 50 MG tablet Take 1 tablet (50 mg  total) by mouth at bedtime as needed for sleep. (Patient not taking: Reported on 10/19/2019) 30 tablet 0     Musculoskeletal: Strength & Muscle Tone: within normal limits Gait & Station: normal Patient leans: N/A            Psychiatric Specialty Exam:  Presentation  General Appearance:  Appropriate for Environment; Fairly Groomed  Eye Contact: Fair  Speech: Clear and Coherent; Normal Rate  Speech Volume: Normal  Handedness: Right   Mood and Affect  Mood: Depressed (Patient endorses some depression; however, patient is pleasant during the assessment)  Affect: Non-Congruent   Thought Process  Thought Processes: Disorganized  Duration of Psychotic Symptoms: Greater than six months  Past Diagnosis of Schizophrenia or Psychoactive disorder: Yes  Descriptions of Associations:Loose  Orientation:Full (Time, Place and Person)  Thought Content:Illogical; Delusions  Hallucinations:Hallucinations: None  Ideas of Reference:Delusions  Suicidal Thoughts:Suicidal Thoughts: No  Homicidal Thoughts:Homicidal Thoughts: No   Sensorium  Memory: Immediate Poor; Recent Poor; Remote  Good  Judgment: Impaired  Insight: Poor   Executive Functions  Concentration: Fair  Attention Span: Fair  Recall: Poor  Fund of Knowledge: Fair  Language: Good   Psychomotor Activity  Psychomotor Activity: Psychomotor Activity: Normal   Assets  Assets: Communication Skills; Social Support; Physical Health   Sleep  Sleep: Sleep: Good Number of Hours of Sleep: 8    Physical Exam: Physical Exam Constitutional:      Appearance: Normal appearance.  HENT:     Head: Normocephalic and atraumatic.     Nose: Nose normal.     Mouth/Throat:     Mouth: Mucous membranes are moist.  Eyes:     Extraocular Movements: Extraocular movements intact.     Pupils: Pupils are equal, round, and reactive to light.  Cardiovascular:     Rate and Rhythm: Normal rate and regular rhythm.  Pulmonary:     Effort: Pulmonary effort is normal.     Breath sounds: Normal breath sounds.  Abdominal:     General: Abdomen is flat.  Musculoskeletal:        General: Normal range of motion.     Cervical back: Normal range of motion and neck supple.  Skin:    General: Skin is warm and dry.  Neurological:     General: No focal deficit present.     Mental Status: He is alert.  Psychiatric:        Attention and Perception: Attention normal. He does not perceive auditory or visual hallucinations.        Mood and Affect: Affect normal. Mood is depressed.        Speech: Speech normal.        Behavior: Behavior is cooperative.        Thought Content: Thought content is delusional. Thought content is not paranoid. Thought content does not include homicidal or suicidal ideation.        Cognition and Memory: Cognition is impaired. Memory is impaired.        Judgment: Judgment is inappropriate.    Review of Systems  Constitutional: Negative.   HENT: Negative.    Eyes: Negative.   Respiratory: Negative.    Cardiovascular: Negative.   Gastrointestinal: Negative.   Musculoskeletal:  Negative.   Skin: Negative.   Neurological: Negative.   Psychiatric/Behavioral:  Positive for depression. Negative for hallucinations, substance abuse and suicidal ideas. The patient is nervous/anxious. The patient does not have insomnia.    Blood pressure 123/71, pulse 87, temperature 98 F (36.7 C),  temperature source Oral, resp. rate 18, height 5\' 4"  (1.626 m), weight 72.6 kg, SpO2 100 %. Body mass index is 27.46 kg/m.  Treatment Plan Summary: Daily contact with patient to assess and evaluate symptoms and progress in treatment and Medication management  Observation Level/Precautions:  15 minute checks  Laboratory:  Labs were independently reviewed on 06/25/2022  Psychotherapy:  Unit group sessions  Medications:  See [facility]  Consultations:  To be determined  Discharge Concerns:  Safety, medication compliance, mood stability  Estimated LOS: 5 - 7 days  Other:  N/A   Physician Treatment Plan for Primary Diagnosis: MDD (major depressive disorder), recurrent, severe, with psychosis (HCC) Long Term Goal(s): Improvement in symptoms so as ready for discharge  Short Term Goals: Ability to identify changes in lifestyle to reduce recurrence of condition will improve, Ability to verbalize feelings will improve, Ability to disclose and discuss suicidal ideas, Ability to demonstrate self-control will improve, Ability to identify and develop effective coping behaviors will improve, Ability to maintain clinical measurements within normal limits will improve, Compliance with prescribed medications will improve, and Ability to identify triggers associated with substance abuse/mental health issues will improve  Physician Treatment Plan for Secondary Diagnosis: Principal Problem:   MDD (major depressive disorder), recurrent, severe, with psychosis (HCC)  Long Term Goal(s): Improvement in symptoms so as ready for discharge  Short Term Goals: Ability to identify changes in lifestyle to reduce recurrence of  condition will improve, Ability to verbalize feelings will improve, Ability to disclose and discuss suicidal ideas, Ability to demonstrate self-control will improve, Ability to identify and develop effective coping behaviors will improve, Ability to maintain clinical measurements within normal limits will improve, Compliance with prescribed medications will improve, and Ability to identify triggers associated with substance abuse/mental health issues will improve   Plan:  Safety and Monitoring: Voluntary admission to inpatient psychiatric unit for safety, stabilization and treatment Daily contact with patient to assess and evaluate symptoms and progress in treatment Patient's case to be discussed in multi-disciplinary team meeting Observation Level : q15 minute checks Vital signs: q12 hours Precautions: suicide, elopement, and assault    Major depressive disorder, recurrent, severe psychosis -Start Risperdal 1 mg 2 times daily for mood stabilization and psychosis. Risperdal to be increased to 1.5 mg 2 times daily. -Cogentin 0.5 mg 2 times daily for management of extrapyramidal symptoms from antipsychotic use  -Agitation protocol  -Risperidone 2 mg every 8 hours as needed for agitation AND  -Lorazepam 1 mg every 6 hours as needed for agitation AND  -Prazosin 20 mg intramuscular injection every 6 hours for agitation   As needed medications: -Patient to continue taking Tylenol 650 mg every 6 hours as needed for mild pain -Patient to continue taking Maalox/Mylanta 30 mL every 4 hours as needed for indigestion -Patient to continue taking Milk of Magnesia 30 mL as needed for mild constipation -Hydroxyzine 25 mg 3 times daily as needed for anxiety -Trazodone 50 mg at bedtime as needed for insomnia  -Nicotine polacrilex 2 mg as needed for smoking cessation  Discharge Planning: Social work and case management to assist with discharge planning and identification of hospital follow-up needs  prior to discharge Estimated LOS: 5-7 days Discharge Concerns: Need to establish a safety plan; Medication compliance and effectiveness Discharge Goals: Return home with outpatient referrals for mental health follow-up including medication management/psychotherapy   I certify that inpatient services furnished can reasonably be expected to improve the patient's condition.    [facility], [facility] 10/17/20231:58  PM

## 2022-06-25 NOTE — Progress Notes (Signed)
Pt reports a good appetite, and no physical problems. Pt endorses AVH of voices that tell him his family will die and that he sees a demon and it follows him around. Pt reports "my family will die Jul 08, 2023, they are on death row". Provided support and encouragement. Pt safe on the unit. Q 15 minute safety checks continued.    06/25/22 2025  Psych Admission Type (Psych Patients Only)  Admission Status Voluntary  Psychosocial Assessment  Patient Complaints Anxiety  Eye Contact Intense  Facial Expression Fixed smile  Affect Euphoric  Speech Pressured  Interaction Childlike;Attention-seeking  Motor Activity Restless  Appearance/Hygiene Bizarre  Behavior Characteristics Cooperative  Mood Elated  Thought Process  Coherency Disorganized;Flight of ideas  Content Magical thinking  Delusions Referential  Perception Hallucinations  Hallucination Auditory;Visual (Pt reports he hears voices that his family is going to die and reports seeing demons)  Judgment Impaired  Confusion None  Danger to Self  Current suicidal ideation? Denies  Danger to Others  Danger to Others None reported or observed
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
## 2022-06-25 NOTE — [facility] Suicide Risk Assessment (Signed)
Suicide Risk Assessment  Admission Assessment    [facility] Admission Suicide Risk Assessment   Nursing information obtained from:  Patient Demographic factors:  Male, Adolescent or young adult Current Mental Status:  NA Loss Factors:  NA Historical Factors:  Impulsivity Risk Reduction Factors:  Positive social support  Total Time spent with patient: 45 minutes Principal Problem: MDD (major depressive disorder), recurrent, severe, with psychosis ([place]) Diagnosis:  Principal Problem:   MDD (major depressive disorder), recurrent, severe, with psychosis ([place])  Subjective Data:  Alec Williams is a 21 year old, African-American male with a past psychiatric history significant for major depressive disorder (recurrent severe, with psychotic features) and self-injurious behavior who voluntarily presented to [facility] for evaluation due to reported 6 months of pregnancy and vomiting.  It was previously seen at this facility on April 2019 and October 2019.  During the assessment, patient is extremely delusional and was constantly being redirected.  Patient's history may be unreliable due to patient's mental state.   Patient reports that he is unsure of why he was admitted to [facility] but states that he was having flulike symptoms prior to his admission to [facility].  Patient states that he believes that he is here to have his flu shot and as soon as he gets his flu shot, he will be discharged.  He reports that he is unsure of what his mother put on his forms before being admitted to this facility.  Patient denies experiencing any bizarre behaviors but states that he was having bad thoughts and was texting things to his cousin prior to admission.   Patient reports that he was experiencing anxiousness due to believing that he cannot get flu shots.  He also reports anxiety due to believing that his mom would not accept him back home.  He reports that he feels that his family does not love him.   Patient also added that he experiences anxiety because of being on a lot of pills.  He states that he hopes this is the last time he is here at this hospital and hopes to be discharged today.  Patient endorses excessive worrying and social anxiety.  He denies panic attack but states that he had a panic attack when in jail.     Patient denies experiencing depression but then reported that he was having depression due to being in prison and having issues with his family.  Patient patient endorses the following depressive symptoms: Insomnia, hypersomnia, lack of interest in activities or hobbies, depressed mood, feelings of guilt/worthlessness, decreased energy, decreased concentration, self-isolation, decreased appetite, irritability, memory issues, crease libido.  He denied suicidal ideations.  Patient added that he has been barely able to sleep since being discharged from jail but while he was in jail, patient states that he slept for 23 hours a day.  Patient reports that he was recently released from prison after serving 25 months.  He then explained that he was released from prison after serving 17 months for stalking someone.  Patient also endorses distractibility, elevated mood, grandiosity, racing thoughts, irritability, mood swings, pressured speech, and sleep deficit.   Patient reported being in a good mood by giving a thumbs up.  He denies current depression and states that he is unsure of anxiety at this time.  He denied suicidal or homicidal ideations but then later stated that he was not sure of homicidal ideations.  He denied auditory or visual hallucinations but states that prior to his admission to the facility, he  was hearing demons and seeing the devil and zombies.  Patient endorses paranoia stating that he is unsure if he will be able to return back home stating that his mother said he needed to go to a shelter.  Patient denies traumatic experiences but states that he has flashbacks of himself  doing witchcraft with his mother.  He reports that his mother has since stopped witchcraft.  Patient reports that he has nightmares that consists God, demons, and witchcraft. During the assessment, patient is extremely delusional and was constantly being redirected.  Patient's history may be unreliable due to patient's current mental state.  Continued Clinical Symptoms:  Patient is very delusional for the majority of the encounter.  The patient initially denied depression during the assessment he reported having depression due to being in prison and having issues with his family.  Patient also endorsed the following depressive symptoms: insomnia, hypersomnia, anhedonia, depressed mood, feelings of guilt/worthlessness, hopelessness, decreased energy, decreased concentration, self-isolation, decreased appetite, agitation, memory issues, decreased libido.  Prior to his assessment with the provider, patient had reported being 6 months pregnant during walk-in encounter  The "Alcohol Use Disorders Identification Test", Guidelines for Use in Primary Care, Second Edition.  World Pharmacologist [facility]). Score between 0-7:  no or low risk or alcohol related problems. Score between 8-15:  moderate risk of alcohol related problems. Score between 16-19:  high risk of alcohol related problems. Score 20 or above:  warrants further diagnostic evaluation for alcohol dependence and treatment.   CLINICAL FACTORS:   Depression:   Anhedonia Delusional Hopelessness Insomnia Currently Psychotic   Musculoskeletal: Strength & Muscle Tone: within normal limits Gait & Station: normal Patient leans: N/A  Psychiatric Specialty Exam:  Presentation  General Appearance:  Appropriate for Environment; Fairly Groomed  Eye Contact: Fair  Speech: Clear and Coherent; Normal Rate  Speech Volume: Normal  Handedness: Right   Mood and Affect  Mood: Depressed (Patient endorses some depression; however, patient  is pleasant during the assessment)  Affect: Non-Congruent   Thought Process  Thought Processes: Disorganized  Descriptions of Associations:Loose  Orientation:Full (Time, Place and Person)  Thought Content:Illogical; Delusions  History of Schizophrenia/Schizoaffective disorder:Yes  Duration of Psychotic Symptoms:Greater than six months  Hallucinations:Hallucinations: None  Ideas of Reference:Delusions  Suicidal Thoughts:Suicidal Thoughts: No  Homicidal Thoughts:Homicidal Thoughts: No   Sensorium  Memory: Immediate Poor; Recent Poor; Remote Good  Judgment: Impaired  Insight: Poor   Executive Functions  Concentration: Fair  Attention Span: Fair  Recall: Poor  Fund of Knowledge: Fair  Language: Good   Psychomotor Activity  Psychomotor Activity: Psychomotor Activity: Normal   Assets  Assets: Communication Skills; Social Support; Physical Health   Sleep  Sleep: Sleep: Good Number of Hours of Sleep: 8    Physical Exam: Physical Exam Constitutional:      Appearance: Normal appearance.  HENT:     Head: Normocephalic and atraumatic.     Nose: Nose normal.     Mouth/Throat:     Mouth: Mucous membranes are moist.  Eyes:     Extraocular Movements: Extraocular movements intact.     Pupils: Pupils are equal, round, and reactive to light.  Cardiovascular:     Rate and Rhythm: Normal rate and regular rhythm.     Pulses: Normal pulses.     Heart sounds: Normal heart sounds.  Abdominal:     General: Abdomen is flat.  Musculoskeletal:        General: Normal range of motion.     Cervical  back: Normal range of motion and neck supple.  Skin:    General: Skin is warm and dry.  Neurological:     General: No focal deficit present.     Mental Status: He is alert.  Psychiatric:        Attention and Perception: Attention normal. He does not perceive auditory or visual hallucinations.        Mood and Affect: Affect normal. Mood is depressed.         Speech: Speech normal.        Behavior: Behavior normal. Behavior is cooperative.        Thought Content: Thought content is delusional. Thought content is not paranoid. Thought content does not include homicidal or suicidal ideation.        Cognition and Memory: Cognition is impaired. Memory is impaired.        Judgment: Judgment is inappropriate.    Review of Systems  Constitutional: Negative.   HENT: Negative.    Eyes: Negative.   Respiratory: Negative.    Cardiovascular: Negative.   Gastrointestinal: Negative.   Musculoskeletal: Negative.   Neurological: Negative.   Psychiatric/Behavioral:  Positive for depression. Negative for hallucinations, substance abuse and suicidal ideas. The patient is not nervous/anxious and does not have insomnia.    Blood pressure 123/71, pulse 87, temperature 98 F (36.7 C), temperature source Oral, resp. rate 18, height 5\' 4"  (1.626 m), weight 72.6 kg, SpO2 100 %. Body mass index is 27.46 kg/m.   COGNITIVE FEATURES THAT CONTRIBUTE TO RISK:  None    SUICIDE RISK:   Moderate:  Frequent suicidal ideation with limited intensity, and duration, some specificity in terms of plans, no associated intent, good self-control, limited dysphoria/symptomatology, some risk factors present, and identifiable protective factors, including available and accessible social support.  PLAN OF CARE:   Safety and Monitoring: Voluntary admission to inpatient psychiatric unit for safety, stabilization and treatment Daily contact with patient to assess and evaluate symptoms and progress in treatment Patient's case to be discussed in multi-disciplinary team meeting Observation Level : q15 minute checks Vital signs: q12 hours Precautions: suicide, elopement, and assault      Major depressive disorder, recurrent, severe psychosis -Start Risperdal 1 mg 2 times daily for mood stabilization and psychosis. Risperdal to be increased to 1.5 mg 2 times daily. -Cogentin 0.5  mg 2 times daily for management of extrapyramidal symptoms from antipsychotic use  -Agitation protocol             -Risperidone 2 mg every 8 hours as needed for agitation AND             -Lorazepam 1 mg every 6 hours as needed for agitation AND             -Prazosin 20 mg intramuscular injection every 6 hours for agitation     As needed medications: -Patient to continue taking Tylenol 650 mg every 6 hours as needed for mild pain -Patient to continue taking Maalox/Mylanta 30 mL every 4 hours as needed for indigestion -Patient to continue taking Milk of Magnesia 30 mL as needed for mild constipation -Hydroxyzine 25 mg 3 times daily as needed for anxiety -Trazodone 50 mg at bedtime as needed for insomnia  -Nicotine polacrilex 2 mg as needed for smoking cessation   Discharge Planning: Social work and case management to assist with discharge planning and identification of hospital follow-up needs prior to discharge Estimated LOS: 5-7 days Discharge Concerns: Need to establish a safety plan; Medication compliance  and effectiveness Discharge Goals: Return home with outpatient referrals for mental health follow-up including medication management/psychotherapy    I certify that inpatient services furnished can reasonably be expected to improve the patient's condition.    I certify that inpatient services furnished can reasonably be expected to improve the patient's condition.   Malachy Mood, PA 06/25/2022, 2:17 PM

## 2022-06-25 NOTE — Plan of Care (Signed)
  Problem: Coping: Goal: Coping ability will improve Outcome: Progressing Goal: Will verbalize feelings Outcome: Progressing   Problem: Education: Goal: Will be free of psychotic symptoms Outcome: Progressing Goal: Knowledge of the prescribed therapeutic regimen will improve Outcome: Progressing   Problem: Safety: Goal: Ability to redirect hostility and anger into socially appropriate behaviors will improve Outcome: Progressing Goal: Ability to remain free from injury will improve Outcome: Progressing

## 2022-06-25 NOTE — Progress Notes (Signed)
   06/25/22 0800  Psych Admission Type (Psych Patients Only)  Admission Status Voluntary  Psychosocial Assessment  Patient Complaints Anxiety  Eye Contact Fair  Facial Expression Fixed smile  Affect Euphoric  Speech Logical/coherent  Interaction Attention-seeking  Motor Activity Restless  Appearance/Hygiene Bizarre  Behavior Characteristics Appropriate to situation;Cooperative  Aggressive Behavior  Effect No apparent injury  Thought Process  Coherency Disorganized;Flight of ideas  Content Magical thinking  Delusions Referential  Perception WDL  Hallucination None reported or observed  Judgment Impaired  Confusion None  Danger to Self  Current suicidal ideation? Denies  Danger to Others  Danger to Others None reported or observed

## 2022-06-25 NOTE — Progress Notes (Signed)
Recreation Therapy Notes  INPATIENT RECREATION THERAPY ASSESSMENT  Patient Details Name: Alec Williams MRN: [id number] DOB: 2000-11-18 Today's Date: 06/25/2022       Information Obtained From: Patient  Able to Participate in Assessment/Interview: Yes  Patient Presentation: Alert  Reason for Admission (Per Patient): Other (Comments) (Anxiety; "people was after me like my family"  "my mom brought me here for a flu shot")  Patient Stressors:  (None identified)  Coping Skills:   Journal, Sports, TV, Music, Exercise, Deep Breathing, Meditate, Art, Talk, Avoidance, Hot Bath/Shower  Leisure Interests (2+):  Social - Friends  Frequency of Recreation/Participation: Other (Comment) (Daily)  Awareness of Community Resources:  Yes  Community Resources:  Park, Engineer, drilling, Other (Comment) Optician, dispensing)  Current Use: Yes  If no, Barriers?:    Expressed Interest in [place]: No  [employer] of Residence:  Investment banker, corporate  Patient Main Form of Transportation: Walk Marketing executive, Banker)  Patient Strengths:  "my smile"  Patient Identified Areas of Improvement:  None  Patient Goal for Hospitalization:  "to get flu shot and nicotine patch to discharge tomorrow"  Current SI (including self-harm):  No  Current HI:  No  Current AVH: No  Staff Intervention Plan: Group Attendance, Collaborate with Interdisciplinary Treatment Team  Consent to Intern Participation: N/A   Paulla Mcclaskey-McCall, LRT,CTRS Advik Weatherspoon A Sherice Ijames-McCall 06/25/2022, 12:22 PM
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
## 2022-06-26 ENCOUNTER — Other Ambulatory Visit (HOSPITAL_COMMUNITY): Payer: Self-pay

## 2022-06-26 ENCOUNTER — Encounter (HOSPITAL_COMMUNITY): Payer: Self-pay

## 2022-06-26 DIAGNOSIS — F259 Schizoaffective disorder, unspecified: Secondary | ICD-10-CM | POA: Diagnosis present

## 2022-06-26 DIAGNOSIS — F333 Major depressive disorder, recurrent, severe with psychotic symptoms: Secondary | ICD-10-CM

## 2022-06-26 LAB — VALPROIC ACID LEVEL: Valproic Acid Lvl: <10 ug/mL — ABNORMAL LOW (ref 50.0–100.0)

## 2022-06-26 MED ORDER — DIVALPROEX SODIUM 500 MG PO DR TAB
500.0000 mg | DELAYED_RELEASE_TABLET | Freq: Every day | ORAL | Status: DC
  Administered 2022-06-27 – 2022-06-28 (×2): 500 mg via ORAL
  Filled 2022-06-26 (×3): qty 1

## 2022-06-26 MED ORDER — UZEDY 100 MG/0.28ML ~~LOC~~ SUSY
100.0000 mg | PREFILLED_SYRINGE | Freq: Once | SUBCUTANEOUS | 0 refills | Status: DC
  Filled 2022-06-26: qty 0.28, 28d supply, fill #0

## 2022-06-26 MED ORDER — DIVALPROEX SODIUM 250 MG PO DR TAB
750.0000 mg | DELAYED_RELEASE_TABLET | Freq: Every day | ORAL | Status: DC
  Administered 2022-06-26 – 2022-06-27 (×2): 750 mg via ORAL
  Filled 2022-06-26 (×4): qty 3

## 2022-06-26 MED ORDER — GUANFACINE HCL 1 MG PO TABS
1.0000 mg | ORAL_TABLET | Freq: Every day | ORAL | Status: DC
  Administered 2022-06-26 – 2022-07-01 (×6): 1 mg via ORAL
  Filled 2022-06-26 (×9): qty 1

## 2022-06-26 MED ORDER — RISPERIDONE ER 100 MG/0.28ML ~~LOC~~ SUSY
100.0000 mg | PREFILLED_SYRINGE | SUBCUTANEOUS | Status: DC
  Administered 2022-06-27: 100 mg via SUBCUTANEOUS
  Filled 2022-06-26: qty 0.28

## 2022-06-26 NOTE — Plan of Care (Signed)
?  Problem: Coping: ?Goal: Coping ability will improve ?Outcome: Progressing ?Goal: Will verbalize feelings ?Outcome: Progressing ?  ?Problem: Education: ?Goal: Will be free of psychotic symptoms ?Outcome: Progressing ?Goal: Knowledge of the prescribed therapeutic regimen will improve ?Outcome: Progressing ?  ?

## 2022-06-26 NOTE — BH IP Treatment Plan (Signed)
Interdisciplinary Treatment and Diagnostic Plan Update  06/26/2022 Time of Session: 1000 Alec Williams MRN: [id number]  Principal Diagnosis: MDD (major depressive disorder), recurrent, severe, with psychosis (HCC)  Secondary Diagnoses: Principal Problem:   MDD (major depressive disorder), recurrent, severe, with psychosis (HCC)   Current Medications:  Current Facility-Administered Medications  Medication Dose Route Frequency Provider Last Rate Last Admin   acetaminophen (TYLENOL) tablet 650 mg  650 mg Oral Q6H PRN Cecilie Lowers, FNP   650 mg at 06/26/22 0819   alum & mag hydroxide-simeth (MAALOX/MYLANTA) 200-200-20 MG/5ML suspension 30 mL  30 mL Oral Q4H PRN Ntuen, Jesusita Oka, FNP       benztropine (COGENTIN) tablet 0.5 mg  0.5 mg Oral BID Nwoko, Uchenna E, PA   0.5 mg at 06/26/22 0820   hydrOXYzine (ATARAX) tablet 25 mg  25 mg Oral TID PRN Cecilie Lowers, FNP   25 mg at 06/25/22 1130   risperiDONE (RISPERDAL M-TABS) disintegrating tablet 2 mg  2 mg Oral Q8H PRN Massengill, Harrold Donath, MD       And   LORazepam (ATIVAN) tablet 1 mg  1 mg Oral Q6H PRN Massengill, Harrold Donath, MD       And   ziprasidone (GEODON) injection 20 mg  20 mg Intramuscular Q6H PRN Massengill, Nathan, MD       magnesium hydroxide (MILK OF MAGNESIA) suspension 30 mL  30 mL Oral Daily PRN Ntuen, Jesusita Oka, FNP       nicotine polacrilex (NICORETTE) gum 2 mg  2 mg Oral PRN Massengill, Nathan, MD   2 mg at 06/25/22 1129   risperiDONE (RISPERDAL) tablet 1 mg  1 mg Oral BID Nwoko, Uchenna E, PA   1 mg at 06/26/22 0820   traZODone (DESYREL) tablet 50 mg  50 mg Oral QHS PRN Cecilie Lowers, FNP   50 mg at 06/25/22 2028   PTA Medications: Medications Prior to Admission  Medication Sig Dispense Refill Last Dose   benztropine (COGENTIN) 0.5 MG tablet Take 1 tablet (0.5 mg total) by mouth 2 (two) times daily. For prevention of drug induced tremors (Patient not taking: Reported on 10/19/2019) 60 tablet 0    hydrOXYzine (ATARAX/VISTARIL) 50  MG tablet Take 1 tablet (50 mg total) by mouth every 6 (six) hours as needed for anxiety. (Patient not taking: Reported on 10/19/2019) 60 tablet 0    risperiDONE (RISPERDAL) 3 MG tablet Take 1 tablet (3 mg total) by mouth 2 (two) times daily. For mood control 30 tablet 0    traZODone (DESYREL) 50 MG tablet Take 1 tablet (50 mg total) by mouth at bedtime as needed for sleep. (Patient not taking: Reported on 10/19/2019) 30 tablet 0     Patient Stressors: Legal issue    Patient Strengths: Education administrator   Treatment Modalities: Medication Management, Group therapy, Case management,  1 to 1 session with clinician, Psychoeducation, Recreational therapy.   Physician Treatment Plan for Primary Diagnosis: MDD (major depressive disorder), recurrent, severe, with psychosis (HCC) Long Term Goal(s): Improvement in symptoms so as ready for discharge   Short Term Goals: Ability to identify changes in lifestyle to reduce recurrence of condition will improve Ability to verbalize feelings will improve Ability to disclose and discuss suicidal ideas Ability to demonstrate self-control will improve Ability to identify and develop effective coping behaviors will improve Ability to maintain clinical measurements within normal limits will improve Compliance with prescribed medications will improve Ability to identify triggers associated with substance abuse/mental health issues  will improve  Medication Management: Evaluate patient's response, side effects, and tolerance of medication regimen.  Therapeutic Interventions: 1 to 1 sessions, Unit Group sessions and Medication administration.  Evaluation of Outcomes: Progressing  Physician Treatment Plan for Secondary Diagnosis: Principal Problem:   MDD (major depressive disorder), recurrent, severe, with psychosis ([name])  Long Term Goal(s): Improvement in symptoms so as ready for discharge   Short Term Goals: Ability to identify changes in  lifestyle to reduce recurrence of condition will improve Ability to verbalize feelings will improve Ability to disclose and discuss suicidal ideas Ability to demonstrate self-control will improve Ability to identify and develop effective coping behaviors will improve Ability to maintain clinical measurements within normal limits will improve Compliance with prescribed medications will improve Ability to identify triggers associated with substance abuse/mental health issues will improve     Medication Management: Evaluate patient's response, side effects, and tolerance of medication regimen.  Therapeutic Interventions: 1 to 1 sessions, Unit Group sessions and Medication administration.  Evaluation of Outcomes: Progressing   RN Treatment Plan for Primary Diagnosis: MDD (major depressive disorder), recurrent, severe, with psychosis ([name]) Long Term Goal(s): Knowledge of disease and therapeutic regimen to maintain health will improve  Short Term Goals: Ability to remain free from injury will improve, Ability to verbalize frustration and anger appropriately will improve, Ability to demonstrate self-control, Ability to participate in decision making will improve, Ability to verbalize feelings will improve, Ability to disclose and discuss suicidal ideas, Ability to identify and develop effective coping behaviors will improve, and Compliance with prescribed medications will improve  Medication Management: RN will administer medications as ordered by provider, will assess and evaluate patient's response and provide education to patient for prescribed medication. RN will report any adverse and/or side effects to prescribing provider.  Therapeutic Interventions: 1 on 1 counseling sessions, Psychoeducation, Medication administration, Evaluate responses to treatment, Monitor vital signs and CBGs as ordered, Perform/monitor CIWA, COWS, AIMS and Fall Risk screenings as ordered, Perform wound care treatments  as ordered.  Evaluation of Outcomes: Progressing   LCSW Treatment Plan for Primary Diagnosis: MDD (major depressive disorder), recurrent, severe, with psychosis ([name]) Long Term Goal(s): Safe transition to appropriate next level of care at discharge, Engage patient in therapeutic group addressing interpersonal concerns.  Short Term Goals: Engage patient in aftercare planning with referrals and resources, Increase social support, Increase ability to appropriately verbalize feelings, Increase emotional regulation, Facilitate acceptance of mental health diagnosis and concerns, Facilitate patient progression through stages of change regarding substance use diagnoses and concerns, Identify triggers associated with mental health/substance abuse issues, and Increase skills for wellness and recovery  Therapeutic Interventions: Assess for all discharge needs, 1 to 1 time with Social worker, Explore available resources and support systems, Assess for adequacy in community support network, Educate family and significant other(s) on suicide prevention, Complete Psychosocial Assessment, Interpersonal group therapy.  Evaluation of Outcomes: Progressing   Progress in Treatment: Attending groups: No. Participating in groups: No. Taking medication as prescribed: Yes. Toleration medication: Yes. Family/Significant other contact made: No, will contact:  csw will obtain consent to reach family/friend.  Patient understands diagnosis: No. Discussing patient identified problems/goals with staff: Yes. Medical problems stabilized or resolved: Yes. Denies suicidal/homicidal ideation: Yes. Issues/concerns per patient self-inventory: Yes. Other: none  New problem(s) identified: No, Describe:  none  New Short Term/Long Term Goal(s): Patient to work towards elimination of symptoms of psychosis, medication management for mood stabilization; development of comprehensive mental wellness plan.  Patient Goals: Patient  states their  goal for treatment is to "find a group home."   Discharge Plan or Barriers: No psychosocial barriers identified at this time, patient to return to place of residence when appropriate for discharge.   Reason for Continuation of Hospitalization: Medication stabilization Other; describe psychosis   Estimated Length of Stay: 1-7days    Scribe for Treatment Team: Almedia Balls 06/26/2022 1:44 PM

## 2022-06-26 NOTE — [facility] Group Notes (Signed)
Adult Psychoeducational Group Note  Date:  06/26/2022 Time:  2:42 PM  Group Topic/Focus:  Wellness Toolbox:   The focus of this group is to discuss various aspects of wellness, balancing those aspects and exploring ways to increase the ability to experience wellness.  Patients will create a wellness toolbox for use upon discharge.  Participation Level:  Active  Participation Quality:  Attentive  Affect:  Appropriate  Cognitive:  Appropriate  Insight: Appropriate  Engagement in Group:  Engaged  Modes of Intervention:  Activity  Additional Comments:  Patient attended and participated in the therapeutic group activity.  Annie Sable 06/26/2022, 2:42 PM

## 2022-06-26 NOTE — Progress Notes (Signed)
[facility] MD Progress Note  06/26/2022 5:21 PM Alec Williams  MRN:  [id number] Subjective:    Alec Williams is a 21 year old, African-American male with a past psychiatric history significant for schizoaffective disorder, major depressive disorder, and self injurious behavior who was involuntarily admitted to [facility] for psychiatric evaluation due to bizarre behavior and delusional thoughts.  Patient was assessed by Hilaria Ota for reevaluation.  Patient is currently being managed on the following psychiatric medications:  Risperdal 1 mg 2 times daily Cogentin 0.5 mg 2 times daily Agitation protocol  -Risperidone 2 mg every 8 hours as needed for agitation AND  -Lorazepam 1 mg every 6 hours as needed for agitation AND  -Geodon 20 mg intramuscular injection every 6 hours as needed for agitation  Patient states that he is doing good.  Patient states that he has received his medication from today and it is going amazing.  Patient states that he prefers to be on the medications that has been given to him during this hospital stay than the medications he was placed on while admitted to [facility].  Patient reports that he was given the following medications [facility]: Risperidone Consta injection, Depakote, guanfacine, and hydroxyzine.  Since being admitted to [facility] and placed on his medications, patient states that he is thinking more clearly.  Patient denies depression nor does he endorse anxiety.  He reports that he rather receive the medications that he has been getting while admitted at this facility.  Patient denies suicidal or homicidal ideations.  He further denies auditory or visual hallucinations and does not appear to be responding to internal/external stimuli.  Patient denies paranoia as well as delusional thoughts.  Patient states that his sleep has been amazing and also endorses good appetite.  Patient denies any new stressors at this time.  Provider was  able to look at documents from patient's admission to [facility].  Patient to be placed back on psychiatric medications that he was taking prior to his discharge from [facility].  Provider was able to contact patient's mother to receive consent for patient being placed on Perseris injection.  Principal Problem: Schizoaffective disorder ([place]) Diagnosis: Principal Problem:   Schizoaffective disorder ([place]) Active Problems:   Major depressive disorder, recurrent, severe with psychotic features ([place])  Total Time spent with patient: 20 minutes  Past Psychiatric History:  Patient denies having a past psychiatric history.   Per patient's previous [facility] admission (07/07/2018), patient had a history of self-injurious behavior and major depressive disorder  Patient has a past history significant for schizoaffective disorder  Past Medical History:  Past Medical History:  Diagnosis Date   Schizophrenia ([place])    History reviewed. No pertinent surgical history. Family History: History reviewed. No pertinent family history. Family Psychiatric  History: not noted Social History:  Social History   Substance and Sexual Activity  Alcohol Use No     Social History   Substance and Sexual Activity  Drug Use No    Social History   Socioeconomic History   Marital status: Single    Spouse name: Not on file   Number of children: Not on file   Years of education: Not on file   Highest education level: Not on file  Occupational History   Not on file  Tobacco Use   Smoking status: Former   Smokeless tobacco: Former   Tobacco comments:    "I smoke black and mild"  Vaping Use   Vaping Use: Every day  Substance and Sexual Activity   Alcohol use: No   Drug use: No   Sexual activity: Not on file  Other Topics Concern   Not on file  Social History Narrative   Not on file   Social Determinants of Health   Financial Resource Strain: Not on file  Food Insecurity:  No Food Insecurity (06/24/2022)   Hunger Vital Sign    Worried About Running Out of Food in the Last Year: Never true    Ran Out of Food in the Last Year: Never true  Transportation Needs: No Transportation Needs (06/24/2022)   PRAPARE - Hydrologist (Medical): No    Lack of Transportation (Non-Medical): No  Physical Activity: Not on file  Stress: Not on file  Social Connections: Not on file   Additional Social History:   Sleep: Good  Appetite:  Good  Current Medications: Current Facility-Administered Medications  Medication Dose Route Frequency Provider Last Rate Last Admin   acetaminophen (TYLENOL) tablet 650 mg  650 mg Oral Q6H PRN Laretta Bolster, FNP   650 mg at 06/26/22 0819   alum & mag hydroxide-simeth (MAALOX/MYLANTA) 200-200-20 MG/5ML suspension 30 mL  30 mL Oral Q4H PRN Ntuen, Kris Hartmann, FNP       benztropine (COGENTIN) tablet 0.5 mg  0.5 mg Oral BID Timika Muench E, PA   0.5 mg at 06/26/22 1707   [START ON 06/27/2022] divalproex (DEPAKOTE) DR tablet 500 mg  500 mg Oral Daily Massengill, Nathan, MD       divalproex (DEPAKOTE) DR tablet 750 mg  750 mg Oral QHS Massengill, Nathan, MD       guanFACINE (TENEX) tablet 1 mg  1 mg Oral QHS Massengill, Nathan, MD       hydrOXYzine (ATARAX) tablet 25 mg  25 mg Oral TID PRN Laretta Bolster, FNP   25 mg at 06/25/22 1130   risperiDONE (RISPERDAL M-TABS) disintegrating tablet 2 mg  2 mg Oral Q8H PRN Massengill, Ovid Curd, MD       And   LORazepam (ATIVAN) tablet 1 mg  1 mg Oral Q6H PRN Massengill, Ovid Curd, MD       And   ziprasidone (GEODON) injection 20 mg  20 mg Intramuscular Q6H PRN Massengill, Nathan, MD       magnesium hydroxide (MILK OF MAGNESIA) suspension 30 mL  30 mL Oral Daily PRN Ntuen, Kris Hartmann, FNP       nicotine polacrilex (NICORETTE) gum 2 mg  2 mg Oral PRN Massengill, Ovid Curd, MD   2 mg at 06/26/22 1413   risperiDONE (RISPERDAL) tablet 1 mg  1 mg Oral BID Estelene Carmack E, PA   1 mg at 06/26/22 1707    [START ON 06/27/2022] risperiDONE ER SUSY 100 mg  100 mg Subcutaneous Q28 days Massengill, Ovid Curd, MD       traZODone (DESYREL) tablet 50 mg  50 mg Oral QHS PRN Ntuen, Kris Hartmann, FNP   50 mg at 06/25/22 2028    Lab Results:  Results for orders placed or performed during the hospital encounter of 06/24/22 (from the past 48 hour(s))  CBC     Status: None   Collection Time: 06/25/22  6:40 AM  Result Value Ref Range   WBC 7.1 4.0 - 10.5 K/uL   RBC 4.49 4.22 - 5.81 MIL/uL   Hemoglobin 14.5 13.0 - 17.0 g/dL   HCT 43.2 39.0 - 52.0 %   MCV 96.2 80.0 - 100.0 fL   MCH  32.3 26.0 - 34.0 pg   MCHC 33.6 30.0 - 36.0 g/dL   RDW 12.0 11.5 - 15.5 %   Platelets 290 150 - 400 K/uL   nRBC 0.0 0.0 - 0.2 %    Comment: Performed at [facility], [place] [address] [place], [place] [address]  Comprehensive metabolic panel     Status: None   Collection Time: 06/25/22  6:40 AM  Result Value Ref Range   Sodium 138 135 - 145 mmol/L   Potassium 3.6 3.5 - 5.1 mmol/L   Chloride 105 98 - 111 mmol/L   CO2 26 22 - 32 mmol/L   Glucose, Bld 82 70 - 99 mg/dL    Comment: Glucose reference range applies only to samples taken after fasting for at least 8 hours.   BUN 8 6 - 20 mg/dL   Creatinine, Ser 0.74 0.61 - 1.24 mg/dL   Calcium 9.2 8.9 - 10.3 mg/dL   Total Protein 6.8 6.5 - 8.1 g/dL   Albumin 4.0 3.5 - 5.0 g/dL   AST 16 15 - 41 U/L   ALT 29 0 - 44 U/L   Alkaline Phosphatase 61 38 - 126 U/L   Total Bilirubin 0.8 0.3 - 1.2 mg/dL   GFR, Estimated >60 >60 mL/min    Comment: (NOTE) Calculated using the CKD-EPI Creatinine Equation (2021)    Anion gap 7 5 - 15    Comment: Performed at [facility], [place] [address] [place], [place] [address]  Lipid panel     Status: None   Collection Time: 06/25/22  6:40 AM  Result Value Ref Range   Cholesterol 147 0 - 200 mg/dL   Triglycerides 70 <150 mg/dL   HDL 43 >40 mg/dL   Total CHOL/HDL Ratio 3.4 RATIO   VLDL 14 0 - 40 mg/dL   LDL  Cholesterol 90 0 - 99 mg/dL    Comment:        Total Cholesterol/HDL:CHD Risk Coronary Heart Disease Risk Table                     Men   Women  1/2 Average Risk   3.4   3.3  Average Risk       5.0   4.4  2 X Average Risk   9.6   7.1  3 X Average Risk  23.4   11.0        Use the calculated Patient Ratio above and the CHD Risk Table to determine the patient's CHD Risk.        ATP III CLASSIFICATION (LDL):  <100     mg/dL   Optimal  100-129  mg/dL   Near or Above                    Optimal  130-159  mg/dL   Borderline  160-189  mg/dL   High  >190     mg/dL   Very High Performed at [place] [address] [place], [place] [address]   TSH     Status: None   Collection Time: 06/25/22  6:40 AM  Result Value Ref Range   TSH 1.312 0.350 - 4.500 uIU/mL    Comment: Performed by a 3rd Generation assay with a functional sensitivity of <=0.01 uIU/mL. Performed at [facility], [place] [address] [place], [place] [address]   Hemoglobin A1c     Status: Abnormal   Collection Time: 06/25/22  6:58 AM  Result Value Ref Range   Hgb A1c MFr Bld 4.7 (L) 4.8 - 5.6 %    Comment: (NOTE) Pre diabetes:          5.7%-6.4%  Diabetes:              >6.4%  Glycemic control for   <7.0% adults with diabetes    Mean Plasma Glucose 88.19 mg/dL    Comment: Performed at [place] [address], [place], [place] [address]  Urinalysis, Complete w Microscopic Urine, Random     Status: Abnormal   Collection Time: 06/25/22 10:53 AM  Result Value Ref Range   Color, Urine YELLOW YELLOW   APPearance CLOUDY (A) CLEAR   Specific Gravity, Urine 1.010 1.005 - 1.030   pH 7.0 5.0 - 8.0   Glucose, UA NEGATIVE NEGATIVE mg/dL   Hgb urine dipstick NEGATIVE NEGATIVE   Bilirubin Urine NEGATIVE NEGATIVE   Ketones, ur NEGATIVE NEGATIVE mg/dL   Protein, ur NEGATIVE NEGATIVE mg/dL   Nitrite NEGATIVE NEGATIVE   Leukocytes,Ua NEGATIVE NEGATIVE   RBC / HPF 0-5 0 - 5  RBC/hpf   Bacteria, UA NONE SEEN NONE SEEN   Squamous Epithelial / LPF 0-5 0 - 5   Amorphous Crystal PRESENT     Comment: Performed at [facility], [place] [address], [place], [place] [address]  Rapid urine drug screen (hospital performed) not at [facility]     Status: None   Collection Time: 06/25/22 10:53 AM  Result Value Ref Range   Opiates NONE DETECTED NONE DETECTED   Cocaine NONE DETECTED NONE DETECTED   Benzodiazepines NONE DETECTED NONE DETECTED   Amphetamines NONE DETECTED NONE DETECTED   Tetrahydrocannabinol NONE DETECTED NONE DETECTED   Barbiturates NONE DETECTED NONE DETECTED    Comment: (NOTE) DRUG SCREEN FOR MEDICAL PURPOSES ONLY.  IF CONFIRMATION IS NEEDED FOR ANY PURPOSE, NOTIFY LAB WITHIN 5 DAYS.  LOWEST DETECTABLE LIMITS FOR URINE DRUG SCREEN Drug Class                     Cutoff (ng/mL) Amphetamine and metabolites    1000 Barbiturate and metabolites    200 Benzodiazepine                 200 Opiates and metabolites        300 Cocaine and metabolites        300 THC                            50 Performed at [facility], [place] [address] [place], [place] [address]     Blood Alcohol level:  Lab Results  Component Value Date   ETH <10 10/19/2019   ETH <10 0000000    Metabolic Disorder Labs: Lab Results  Component Value Date   HGBA1C 4.7 (L) 06/25/2022   MPG 88.19 06/25/2022   MPG 85.32 07/09/2018   Lab Results  Component Value Date   PROLACTIN 22.9 (H) 07/09/2018   PROLACTIN 10.6 12/10/2017   Lab Results  Component Value Date   CHOL 147 06/25/2022   TRIG 70 06/25/2022   HDL 43 06/25/2022   CHOLHDL 3.4 06/25/2022   VLDL 14 06/25/2022   LDLCALC 90 06/25/2022   LDLCALC 79 07/09/2018    Physical Findings: AIMS: Facial and Oral Movements Muscles of Facial Expression: None, normal Lips and Perioral Area: None, normal Jaw: None, normal Tongue: None, normal,Extremity  Movements Upper (arms, wrists, hands,  fingers): None, normal Lower (legs, knees, ankles, toes): None, normal, Trunk Movements Neck, shoulders, hips: None, normal, Overall Severity Severity of abnormal movements (highest score from questions above): None, normal Incapacitation due to abnormal movements: None, normal Patient's awareness of abnormal movements (rate only patient's report): No Awareness, Dental Status Current problems with teeth and/or dentures?: No Does patient usually wear dentures?: No  CIWA:    COWS:     Musculoskeletal: Strength & Muscle Tone: within normal limits Gait & Station: normal Patient leans: N/A  Psychiatric Specialty Exam:  Presentation  General Appearance:  Appropriate for Environment; Casual  Eye Contact: Good  Speech: Clear and Coherent; Normal Rate  Speech Volume: Normal  Handedness: Right   Mood and Affect  Mood: Euthymic  Affect: Congruent   Thought Process  Thought Processes: Coherent  Descriptions of Associations:Intact  Orientation:Full (Time, Place and Person)  Thought Content:Delusions  History of Schizophrenia/Schizoaffective disorder:Yes  Duration of Psychotic Symptoms:Greater than six months  Hallucinations:Hallucinations: None  Ideas of Reference:Delusions  Suicidal Thoughts:Suicidal Thoughts: No  Homicidal Thoughts:Homicidal Thoughts: No   Sensorium  Memory: Immediate Fair; Recent Fair; Remote Fair  Judgment: Fair  Insight: Fair   Art therapist  Concentration: Good  Attention Span: Good  Recall: Fair  Fund of Knowledge: Fair  Language: Good   Psychomotor Activity  Psychomotor Activity: Psychomotor Activity: Normal   Assets  Assets: Communication Skills; Social Support; Physical Health   Sleep  Sleep: Sleep: Good    Physical Exam: Physical Exam Constitutional:      Appearance: Normal appearance.  HENT:     Head: Normocephalic and atraumatic.     Nose: Nose normal.     Mouth/Throat:      Mouth: Mucous membranes are moist.  Eyes:     Extraocular Movements: Extraocular movements intact.     Pupils: Pupils are equal, round, and reactive to light.  Cardiovascular:     Rate and Rhythm: Normal rate and regular rhythm.  Pulmonary:     Effort: Pulmonary effort is normal.     Breath sounds: Normal breath sounds.  Abdominal:     General: Abdomen is flat.  Musculoskeletal:        General: Normal range of motion.     Cervical back: Normal range of motion and neck supple.  Skin:    General: Skin is warm and dry.  Neurological:     General: No focal deficit present.     Mental Status: He is alert and oriented to person, place, and time.  Psychiatric:        Attention and Perception: Attention and perception normal. He does not perceive auditory or visual hallucinations.        Mood and Affect: Mood and affect normal. Mood is not anxious or depressed.        Speech: Speech normal.        Behavior: Behavior normal. Behavior is cooperative.        Thought Content: Thought content is delusional. Thought content is not paranoid. Thought content does not include homicidal or suicidal ideation.        Cognition and Memory: Cognition and memory normal.        Judgment: Judgment normal.    Review of Systems  Psychiatric/Behavioral:  Negative for depression, hallucinations, substance abuse and suicidal ideas. The patient is not nervous/anxious and does not have insomnia.    Blood pressure 128/71, pulse 66, temperature 99 F (37.2 C), temperature source Oral, resp. rate  18, height 5\' 4"  (1.626 m), weight 72.6 kg, SpO2 99 %. Body mass index is 27.46 kg/m.   Treatment Plan Summary: Daily contact with patient to assess and evaluate symptoms and progress in treatment and Medication management  Diagnosis: Principal Problem:   Schizoaffective disorder ([place]) Active Problems:   Major depressive disorder, recurrent, severe with psychotic features ([place])  #Major depressive disorder,  recurrent, severe with psychotic features #Schizoaffective disorder -Administer risperidone ER SUSY 100 mg subcutaneous injection for mood stabilization -Continue risperidone 1 mg 2 times daily for mood stabilization -Continue Cogentin 0.5 mg 2 times daily for management of his extrapyramidal symptoms from antipsychotic use -Start Depakote DR 500 mg daily/750 mg at bedtime for mood stabilization -Start guanfacine 1 mg at bedtime for agitation -Agitation protocol             -Risperidone 2 mg every 8 hours as needed for agitation AND             -Lorazepam 1 mg every 6 hours as needed for agitation AND             -Ziprasidone 20 mg intramuscular injection every 6 hours for agitation   As needed medications: -Patient to continue taking Tylenol 650 mg every 6 hours as needed for mild pain -Patient to continue taking Maalox/Mylanta 30 mL every 4 hours as needed for indigestion -Patient to continue taking Milk of Magnesia 30 mL as needed for mild constipation -Hydroxyzine 25 mg 3 times daily as needed for anxiety -Trazodone 50 mg at bedtime as needed for insomnia  -Nicotine polacrilex 2 mg as needed for smoking cessation  Discharge Planning: Social work and case management to assist with discharge planning and identification of hospital follow-up needs prior to discharge Estimated LOS: 5-7 days Discharge Concerns: Need to establish a safety plan; Medication compliance and effectiveness Discharge Goals: Return home with outpatient referrals for mental health follow-up including medication management/psychotherapy    I certify that inpatient services furnished can reasonably be expected to improve the   Malachy Mood, PA 06/26/2022, 5:21 PM

## 2022-06-26 NOTE — Group Note (Signed)
Recreation Therapy Group Note   Group Topic:Relaxation  Group Date: 06/26/2022 Start Time: 1000 End Time: 2841 Facilitators: Justino Boze-McCall, LRT,CTRS Location: [address] Dayroom   Goal Area(s) Addresses:  Patient will successfully identify songs that help them relax. Patient will identify healthy ways to increase relaxation. Patient will acknowledge benefit(s) of relaxation.  Group Description:  Relaxation Music.  Patients were allowed to pick songs that helped them relax and have meaning to them.  LRT played the songs requested by patients.  Patients sang a long with and moved around to the beat of the songs being played during group session.   Affect/Mood: Appropriate   Participation Level: Engaged   Participation Quality: Independent   Behavior: Appropriate   Speech/Thought Process: Focused   Insight: Good   Judgement: Good   Modes of Intervention: Music   Patient Response to Interventions:  Engaged   Education Outcome:  Acknowledges education and In group clarification offered    Clinical Observations/Individualized Feedback: Pt was upbeat and engaged during group.  Pt was rocking along to the songs played during group.  Pt was appropriate throughout group session.     Plan: Continue to engage patient in RT group sessions 2-3x/week.   Trelon Plush-McCall, LRT,CTRS 06/26/2022 12:54 PM

## 2022-06-26 NOTE — Group Note (Signed)
Type of Therapy and Topic:  Group Therapy:  Stress Management   Participation Level:  Did Not Attend    Description of Group:  Patients in this group were introduced to the idea of stress and encouraged to discuss negative and positive ways to manage stress. Patients discussed specific stressors that they have in their life right now and the physical signs and symptoms associated with that stress.  Patient encouraged to come up with positive changes to assist with the stress upon discharge in order to prevent future hospitalizations.   They also worked as a group on developing a specific plan for several patients to deal with stressors through boundary-setting, psychoeducation and self care techniques   Therapeutic Goals:               1)  To discuss the positive and negative impacts of stress             2)  identify signs and symptoms of stress             3)  generate ideas for stress management             4)  offer mutual support to others regarding stress management             5)  Developing plans for ways to manage specific stressors upon discharge               Summary of Patient Progress:  CSW and multiple staff tried to engage patient in participating in group with no luck.  Patient continues to decline participation.    Therapeutic Modalities:   Motivational Interviewing Brief Solution-Focused Therapy   Herley Bernardini, LCSW, LCAS Clincal Social Worker  [facility]    

## 2022-06-26 NOTE — Progress Notes (Signed)
   06/26/22 0800  Psych Admission Type (Psych Patients Only)  Admission Status Voluntary  Psychosocial Assessment  Patient Complaints Anxiety  Eye Contact Intense  Facial Expression Fixed smile  Affect Euphoric  Speech Pressured  Interaction Childlike;Attention-seeking  Motor Activity Restless  Appearance/Hygiene Bizarre  Behavior Characteristics Cooperative  Mood Euphoric  Aggressive Behavior  Effect No apparent injury  Thought Process  Coherency Disorganized  Content Magical thinking  Delusions Referential  Perception Hallucinations  Hallucination Auditory;Visual  Judgment Impaired  Confusion Mild  Danger to Self  Current suicidal ideation? Denies  Danger to Others  Danger to Others None reported or observed

## 2022-06-27 ENCOUNTER — Other Ambulatory Visit (HOSPITAL_COMMUNITY): Payer: Self-pay

## 2022-06-27 DIAGNOSIS — F259 Schizoaffective disorder, unspecified: Principal | ICD-10-CM

## 2022-06-27 NOTE — [facility] Suicide Risk Assessment (Signed)
[place] INPATIENT:  Family/Significant Other Suicide Prevention Education  Suicide Prevention Education:  Education Completed; Elson Clan, [phone]  (name of family member/significant other) has been identified by the patient as the family member/significant other with whom the patient will be residing, and identified as the person(s) who will aid the patient in the event of a mental health crisis (suicidal ideations/suicide attempt).  With written consent from the patient, the family member/significant other has been provided the following suicide prevention education, prior to the and/or following the discharge of the patient.  CSW spoke with patient mother who reports that at this time patient can not return home. She reports that he is stealing from family members and mother works a lot and feels like he can't be around to take care of himself.  Mother states that he accuses people of doing things that are delusional.  At this time mother states that patient states that he gets disability but she is unsure because has not seen a check and does not believe patient has received in money at this time.  Patient has no access to guns or weapons that she knows of and has no additional safety concerns.   The suicide prevention education provided includes the following: Suicide risk factors Suicide prevention and interventions National Suicide Hotline telephone number [facility] assessment telephone number [facility] Emergency Assistance [place] and/or Residential Mobile Crisis Unit telephone number  Request made of family/significant other to: Remove weapons (e.g., guns, rifles, knives), all items previously/currently identified as safety concern.   Remove drugs/medications (over-the-counter, prescriptions, illicit drugs), all items previously/currently identified as a safety concern.  The family member/significant other verbalizes understanding of the suicide  prevention education information provided.  The family member/significant other agrees to remove the items of safety concern listed above.  Kalani Sthilaire E Chandy Tarman 06/27/2022, 1:09 PM

## 2022-06-27 NOTE — [facility] Counselor (Signed)
[facility] LCSW Progress Note   06/27/2022    4:05 PM  Guido Comp   [phone]   Type of Contact and Topic:  Resources of [employer] and [place] resources   CSW provided resources for affordable housing upon patient request.  Confirmed that mother will not let him stay with her.  CSW provided resource of [employer] as well as list of local shelters and advised to call.  Patient agreed.     Signed:  Aseem Sessums LCSW, LCAS 06/27/2022 4:05 PM

## 2022-06-27 NOTE — Progress Notes (Signed)
No acute distress over night. Medication compliant.    06/26/22 2300  Psych Admission Type (Psych Patients Only)  Admission Status Voluntary  Psychosocial Assessment  Patient Complaints Anxiety  Eye Contact Intense  Facial Expression Fixed smile  Affect Euphoric  Speech Pressured  Interaction Attention-seeking;Childlike;Demanding  Motor Activity Restless  Appearance/Hygiene Meticulous  Behavior Characteristics Cooperative  Mood Euphoric  Aggressive Behavior  Effect No apparent injury  Thought Process  Coherency Disorganized  Content Magical thinking  Delusions Referential  Perception WDL  Hallucination None reported or observed  Judgment Impaired  Confusion Mild  Danger to Self  Current suicidal ideation? Denies  Danger to Others  Danger to Others None reported or observed

## 2022-06-27 NOTE — Group Note (Signed)
Occupational Therapy Group Note  Group Topic:Coping Skills  Group Date: 06/27/2022 Start Time: 1400 End Time: 1445 Facilitators: Brantley Stage, OT   Group Description: Group encouraged increased engagement and participation through discussion and activity focused on "Coping Ahead." Patients were split up into teams and selected a card from a stack of positive coping strategies. Patients were instructed to act out/charade the coping skill for other peers to guess and receive points for their team. Discussion followed with a focus on identifying additional positive coping strategies and patients shared how they were going to cope ahead over the weekend while continuing hospitalization stay.  Therapeutic Goal(s): Identify positive vs negative coping strategies. Identify coping skills to be used during hospitalization vs coping skills outside of hospital/at home Increase participation in therapeutic group environment and promote engagement in treatment   Participation Level: Active   Participation Quality: Independent   Behavior: Appropriate   Speech/Thought Process: Loose association    Affect/Mood: Full range   Insight: Poor   Judgement: Poor   Individualization: pt was active and tangential in their participation of group discussion/activity. Minimal skills identified  Modes of Intervention: Discussion  Patient Response to Interventions:  Engaged   Plan: Continue to engage patient in OT groups 2 - 3x/week.  06/27/2022  Brantley Stage, OT Cornell Barman, OT

## 2022-06-27 NOTE — Group Note (Signed)
Recreation Therapy Group Note   Group Topic:Problem Solving  Group Date: 06/27/2022 Start Time: 5465 End Time: 1030 Facilitators: Nirvi Boehler-McCall, LRT,CTRS Location: [address] Dayroom   Goal Area(s) Addresses:  Patient will effectively work with peer towards shared goal.  Patient will identify factors that guided their decision making.  Patient will pro-socially communicate ideas during group session.    Group Description: Patients were given a scenario that they were going to be stranded on a deserted [place] for several months before being rescued. Writer tasked them with making a list of 15 things they would choose to bring with them for "survival". The list of items was prioritized most important to least. Each patient would come up with their own list, then work together to create a new list of 15 items while in a group of 3-5 peers. LRT discussed each person's list and how it differed from others. The debrief included discussion of priorities, good decisions versus bad decisions, and how it is important to think before acting so we can make the best decision possible. LRT tied the concept of effective communication among group members to patient's support systems outside of the hospital and its benefit post discharge.   Affect/Mood: Appropriate   Participation Level: Engaged   Participation Quality: Independent   Behavior: Appropriate   Speech/Thought Process: Delusional and Relevant   Insight: Fair   Judgement: Fair    Modes of Intervention: Activity   Patient Response to Interventions:  Engaged   Education Outcome:  Acknowledges education and In group clarification offered    Clinical Observations/Individualized Feedback: Pt was bright and smiling throughout group session.  Pt did display some delusional thinking by saying he is a celebrity and [employer] is his business.  Pt also stated Pamella Pert is his best friend.  Pt identified items such as Iphone 15 max  pro, best friends, [employer] Altima, [employer] card, skittles, honey buns, twix and family members as what he would take.  When challenged, pt was unable to make valid points as to why some of his items would be useful or how they would work.  When the group came together to make their list, pt interacted well with peers and seemed to take the lead role.      Plan: Continue to engage patient in RT group sessions 2-3x/week.   Blessed Girdner-McCall, LRT,CTRS 06/27/2022 11:47 AM

## 2022-06-27 NOTE — Progress Notes (Signed)
[facility] MD Progress Note  06/27/2022 1:34 PM Alec Williams  MRN:  [id number] Subjective:    Alec Williams is a 21 year old, African-American male with a past psychiatric history significant for schizoaffective disorder, major depressive disorder, and self injurious behavior who was involuntarily admitted to [facility] for psychiatric evaluation due to bizarre behavior and delusional thoughts.   Patient was assessed by Dominga Ferry for reevaluation.  Patient is currently being managed on the following psychiatric medications:  Risperidone 1 mg 2 times daily Cogentin 0.5 mg 2 times daily Depakote DR 500 mg daily/750 mg at bedtime Guanfacine 1 mg at bedtime Risperidone ER SUSY (Uzedy) 100 mg subcutaneous, every 28 days Trazodone 50 at bedtime as needed Hydroxyzine 25 mg 3 times daily as needed Agitation protocol             -Risperidone 2 mg every 8 hours as needed for agitation AND             -Lorazepam 1 mg every 6 hours as needed for agitation AND             -Geodon 20 mg intramuscular injection every 6 hours as needed for agitation  Patient reports that he recently got off the phone with his 46 year old son who is in the [employer].  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  Reports that he needs to get his badge from the court in order to continue being a guardian ad litem.  Provider informed patient about his risperidone section Camelia Eng) which the patient was comfortable with receiving.  Patient denies depression nor does he endorse anxiety.  Patient denies experiencing any adverse effects from his recent medications that he was placed on.  Patient denies suicidal or homicidal ideations.  He further denies auditory or visual hallucinations and does not appear to be responding to internal/external stimuli.  Patient denies paranoia and further denies delusional thoughts.  Patient reports good sleep and states that he slept for 10 hours.  Patient endorses good appetite.   Patient assures provider that he has been going to group.  Patient denies any other issues or concerns at this time.  Principal Problem: Schizoaffective disorder (HCC) Diagnosis: Principal Problem:   Schizoaffective disorder (HCC) Active Problems:   Major depressive disorder, recurrent, severe with psychotic features (HCC)  Total Time spent with patient: 15 minutes  Past Psychiatric History:  Patient denies having a past psychiatric history.   Per patient's previous [facility] admission (07/07/2018), patient had a history of self-injurious behavior and major depressive disorder   Patient has a past history significant for schizoaffective disorder  Past Medical History:  Past Medical History:  Diagnosis Date   Schizophrenia (HCC)    History reviewed. No pertinent surgical history. Family History: History reviewed. No pertinent family history. Family Psychiatric  History:    Social History:  Social History   Substance and Sexual Activity  Alcohol Use No     Social History   Substance and Sexual Activity  Drug Use No    Social History   Socioeconomic History   Marital status: Single    Spouse name: Not on file   Number of children: Not on file   Years of education: Not on file   Highest education level: Not on file  Occupational History   Not on file  Tobacco Use   Smoking status: Former   Smokeless tobacco: Former   Tobacco comments:    "I smoke black and mild"  Vaping Use  Vaping Use: Every day  Substance and Sexual Activity   Alcohol use: No   Drug use: No   Sexual activity: Not on file  Other Topics Concern   Not on file  Social History Narrative   Not on file   Social Determinants of Health   Financial Resource Strain: Not on file  Food Insecurity: No Food Insecurity (06/24/2022)   Hunger Vital Sign    Worried About Running Out of Food in the Last Year: Never true    Ran Out of Food in the Last Year: Never true  Transportation Needs: No Transportation  Needs (06/24/2022)   PRAPARE - Hydrologist (Medical): No    Lack of Transportation (Non-Medical): No  Physical Activity: Not on file  Stress: Not on file  Social Connections: Not on file   Additional Social History:    Sleep: Good  Appetite:  Good  Current Medications: Current Facility-Administered Medications  Medication Dose Route Frequency Provider Last Rate Last Admin   acetaminophen (TYLENOL) tablet 650 mg  650 mg Oral Q6H PRN Laretta Bolster, FNP   650 mg at 06/26/22 2012   alum & mag hydroxide-simeth (MAALOX/MYLANTA) 200-200-20 MG/5ML suspension 30 mL  30 mL Oral Q4H PRN Ntuen, Kris Hartmann, FNP       benztropine (COGENTIN) tablet 0.5 mg  0.5 mg Oral BID Alis Sawchuk E, PA   0.5 mg at 06/27/22 0757   divalproex (DEPAKOTE) DR tablet 500 mg  500 mg Oral Daily Massengill, Ovid Curd, MD   500 mg at 06/27/22 0756   divalproex (DEPAKOTE) DR tablet 750 mg  750 mg Oral QHS Massengill, Ovid Curd, MD   750 mg at 06/26/22 2011   guanFACINE (TENEX) tablet 1 mg  1 mg Oral QHS Massengill, Ovid Curd, MD   1 mg at 06/26/22 2131   hydrOXYzine (ATARAX) tablet 25 mg  25 mg Oral TID PRN Laretta Bolster, FNP   25 mg at 06/27/22 1303   risperiDONE (RISPERDAL M-TABS) disintegrating tablet 2 mg  2 mg Oral Q8H PRN Massengill, Ovid Curd, MD   2 mg at 06/26/22 2012   And   LORazepam (ATIVAN) tablet 1 mg  1 mg Oral Q6H PRN Massengill, Ovid Curd, MD       And   ziprasidone (GEODON) injection 20 mg  20 mg Intramuscular Q6H PRN Massengill, Nathan, MD       magnesium hydroxide (MILK OF MAGNESIA) suspension 30 mL  30 mL Oral Daily PRN Ntuen, Kris Hartmann, FNP       nicotine polacrilex (NICORETTE) gum 2 mg  2 mg Oral PRN Massengill, Ovid Curd, MD   2 mg at 06/27/22 1303   risperiDONE (RISPERDAL) tablet 1 mg  1 mg Oral BID Lylianna Fraiser E, PA   1 mg at 06/27/22 0756   risperiDONE ER SUSY 100 mg  100 mg Subcutaneous Q28 days Massengill, Ovid Curd, MD   100 mg at 06/27/22 1301   traZODone (DESYREL) tablet 50 mg  50 mg  Oral QHS PRN Ntuen, Kris Hartmann, FNP   50 mg at 06/26/22 2012    Lab Results:  Results for orders placed or performed during the hospital encounter of 06/24/22 (from the past 48 hour(s))  Valproic acid level     Status: Abnormal   Collection Time: 06/26/22  6:22 PM  Result Value Ref Range   Valproic Acid Lvl <10 (L) 50.0 - 100.0 ug/mL    Comment: RESULT CONFIRMED BY MANUAL DILUTION Performed at [facility],  [address] [place], [place] [address]     Blood Alcohol level:  Lab Results  Component Value Date   ETH <10 10/19/2019   ETH <10 07/07/2018    Metabolic Disorder Labs: Lab Results  Component Value Date   HGBA1C 4.7 (L) 06/25/2022   MPG 88.19 06/25/2022   MPG 85.32 07/09/2018   Lab Results  Component Value Date   PROLACTIN 22.9 (H) 07/09/2018   PROLACTIN 10.6 12/10/2017   Lab Results  Component Value Date   CHOL 147 06/25/2022   TRIG 70 06/25/2022   HDL 43 06/25/2022   CHOLHDL 3.4 06/25/2022   VLDL 14 06/25/2022   LDLCALC 90 06/25/2022   LDLCALC 79 07/09/2018    Physical Findings: AIMS: Facial and Oral Movements Muscles of Facial Expression: None, normal Lips and Perioral Area: None, normal Jaw: None, normal Tongue: None, normal,Extremity Movements Upper (arms, wrists, hands, fingers): None, normal Lower (legs, knees, ankles, toes): None, normal, Trunk Movements Neck, shoulders, hips: None, normal, Overall Severity Severity of abnormal movements (highest score from questions above): None, normal Incapacitation due to abnormal movements: None, normal Patient's awareness of abnormal movements (rate only patient's report): No Awareness, Dental Status Current problems with teeth and/or dentures?: No Does patient usually wear dentures?: No  CIWA:    COWS:     Musculoskeletal: Strength & Muscle Tone: within normal limits Gait & Station: normal Patient leans: N/A  Psychiatric Specialty Exam:  Presentation  General Appearance:   Appropriate for Environment; Casual  Eye Contact: Good  Speech: Clear and Coherent; Normal Rate  Speech Volume: Normal  Handedness: Right   Mood and Affect  Mood: Euthymic  Affect: Congruent   Thought Process  Thought Processes: Coherent  Descriptions of Associations:Intact  Orientation:Full (Time, Place and Person)  Thought Content:Delusions  History of Schizophrenia/Schizoaffective disorder:Yes  Duration of Psychotic Symptoms:Greater than six months  Hallucinations:Hallucinations: None  Ideas of Reference:Delusions  Suicidal Thoughts:Suicidal Thoughts: No  Homicidal Thoughts:Homicidal Thoughts: No   Sensorium  Memory: Immediate Fair; Recent Fair; Remote Fair  Judgment: Fair  Insight: Fair   Art therapist  Concentration: Good  Attention Span: Good  Recall: Fair  Fund of Knowledge: Fair  Language: Good   Psychomotor Activity  Psychomotor Activity: Psychomotor Activity: Normal   Assets  Assets: Communication Skills; Social Support; Physical Health   Sleep  Sleep: Sleep: Good    Physical Exam: Physical Exam Constitutional:      Appearance: Normal appearance.  HENT:     Head: Normocephalic and atraumatic.     Nose: Nose normal.     Mouth/Throat:     Mouth: Mucous membranes are moist.  Eyes:     Extraocular Movements: Extraocular movements intact.     Pupils: Pupils are equal, round, and reactive to light.  Cardiovascular:     Rate and Rhythm: Normal rate and regular rhythm.  Pulmonary:     Effort: Pulmonary effort is normal.     Breath sounds: Normal breath sounds.  Abdominal:     General: Abdomen is flat.  Musculoskeletal:        General: Normal range of motion.     Cervical back: Normal range of motion and neck supple.  Skin:    General: Skin is warm and dry.  Neurological:     General: No focal deficit present.     Mental Status: He is alert and oriented to person, place, and time.   Psychiatric:        Attention and Perception: Attention and perception  normal. He does not perceive auditory or visual hallucinations.        Mood and Affect: Mood and affect normal. Mood is not anxious or depressed.        Speech: Speech normal.        Behavior: Behavior normal. Behavior is cooperative.        Thought Content: Thought content is delusional. Thought content is not paranoid. Thought content does not include homicidal or suicidal ideation.        Cognition and Memory: Cognition and memory normal.        Judgment: Judgment normal.    Review of Systems  Constitutional: Negative.   HENT: Negative.    Eyes: Negative.   Respiratory: Negative.    Cardiovascular: Negative.   Gastrointestinal: Negative.   Skin: Negative.   Neurological: Negative.   Psychiatric/Behavioral:  Negative for depression, hallucinations, substance abuse and suicidal ideas. The patient is not nervous/anxious and does not have insomnia.    Blood pressure 107/64, pulse (!) 119, temperature 98.2 F (36.8 C), temperature source Oral, resp. rate 18, height 5\' 4"  (1.626 m), weight 72.6 kg, SpO2 98 %. Body mass index is 27.46 kg/m.   Treatment Plan Summary:  Daily contact with patient to assess and evaluate symptoms and progress in treatment and Medication management  Plan:  Diagnosis: Principal Problem:   Schizoaffective disorder (HCC) Active Problems:   Major depressive disorder, recurrent, severe with psychotic features (HCC)   #Major depressive disorder, recurrent, severe with psychotic features #Schizoaffective disorder -Administer risperidone ER SUSY 100 mg subcutaneous injection for mood stabilization -Continue risperidone 1 mg 2 times daily for mood stabilization -Continue Cogentin 0.5 mg 2 times daily for management of his extrapyramidal symptoms from antipsychotic use -Continue Depakote DR 500 mg daily/750 mg at bedtime for mood stabilization -Continue guanfacine 1 mg at bedtime for  agitation -Agitation protocol             -Risperidone 2 mg every 8 hours as needed for agitation AND             -Lorazepam 1 mg every 6 hours as needed for agitation AND             -Ziprasidone 20 mg intramuscular injection every 6 hours for agitation     As needed medications: -Patient to continue taking Tylenol 650 mg every 6 hours as needed for mild pain -Patient to continue taking Maalox/Mylanta 30 mL every 4 hours as needed for indigestion -Patient to continue taking Milk of Magnesia 30 mL as needed for mild constipation -Hydroxyzine 25 mg 3 times daily as needed for anxiety -Trazodone 50 mg at bedtime as needed for insomnia  -Nicotine polacrilex 2 mg as needed for smoking cessation   Discharge Planning: Social work and case management to assist with discharge planning and identification of hospital follow-up needs prior to discharge Estimated LOS: 5-7 days Discharge Concerns: Need to establish a safety plan; Medication compliance and effectiveness Discharge Goals: Return home with outpatient referrals for mental health follow-up including medication management/psychotherapy   I certify that inpatient services furnished can reasonably be expected to improve the patient's condition.    , PA 06/27/2022, 1:34 PM

## 2022-06-27 NOTE — Progress Notes (Signed)
   06/27/22 1100  Psych Admission Type (Psych Patients Only)  Admission Status Voluntary  Psychosocial Assessment  Patient Complaints Anxiety  Eye Contact Intense  Facial Expression Fixed smile  Affect Euphoric  Speech Logical/coherent  Interaction Attention-seeking;Childlike  Motor Activity Restless  Appearance/Hygiene Bizarre  Behavior Characteristics Cooperative  Mood Euphoric  Aggressive Behavior  Effect No apparent injury  Thought Process  Coherency Disorganized;Flight of ideas  Content Magical thinking  Delusions Referential  Perception WDL  Hallucination Auditory;Visual  Judgment Impaired  Confusion Mild  Danger to Self  Current suicidal ideation? Denies  Danger to Others  Danger to Others None reported or observed

## 2022-06-28 MED ORDER — DIVALPROEX SODIUM ER 500 MG PO TB24
1500.0000 mg | ORAL_TABLET | Freq: Every day | ORAL | Status: DC
  Administered 2022-06-28 – 2022-06-30 (×3): 1500 mg via ORAL
  Filled 2022-06-28 (×5): qty 3

## 2022-06-28 MED ORDER — RISPERIDONE 2 MG PO TBDP
2.0000 mg | ORAL_TABLET | Freq: Every day | ORAL | Status: DC
  Administered 2022-06-28 – 2022-07-01 (×4): 2 mg via ORAL
  Filled 2022-06-28 (×7): qty 1

## 2022-06-28 MED ORDER — RISPERIDONE 1 MG PO TBDP
1.0000 mg | ORAL_TABLET | Freq: Every day | ORAL | Status: DC
  Administered 2022-06-29 – 2022-06-30 (×2): 1 mg via ORAL
  Filled 2022-06-28 (×3): qty 1

## 2022-06-28 MED ORDER — MEDERMA EX GEL
Freq: Every day | CUTANEOUS | Status: DC
  Filled 2022-06-28: qty 20

## 2022-06-28 NOTE — Group Note (Signed)
Recreation Therapy Group Note   Group Topic:Team Building  Group Date: 06/28/2022 Start Time: 1000 End Time: 8768 Facilitators: Hezikiah Retzloff-McCall, LRT,CTRS Location: [address] Dayroom   Goal Area(s) Addresses:  Patient will effectively work with peer towards shared goal.  Patient will identify skills used to make activity successful.  Patient will identify how skills used during activity can be applied to reach post d/c goals.   Group Description: [employer]. In teams of 5-6, patients were given 11 craft pipe cleaners. Using the materials provided, patients were instructed to compete again the opposing team(s) to build the tallest free-standing structure from floor level. The activity was timed; difficulty increased by Probation officer as Pharmacist, hospital continued.  Systematically resources were removed with additional directions for example, placing one arm behind their back, working in silence, and shape stipulations. LRT facilitated post-activity discussion reviewing team processes and necessary communication skills involved in completion. Patients were encouraged to reflect how the skills utilized, or not utilized, in this activity can be incorporated to positively impact support systems post discharge.   Affect/Mood: Appropriate   Participation Level: Minimal   Participation Quality: Independent   Behavior: Cooperative and Disinterested   Speech/Thought Process: Delusional   Insight: Fair   Judgement: Fair    Modes of Intervention: STEM Activity   Patient Response to Interventions:  Attentive   Education Outcome:  Acknowledges education and In group clarification offered    Clinical Observations/Individualized Feedback: Pt was engaged in the beginning of group but then became disinterested.  Pt observed to the majority of group.  Pt was social with peers.  Pt was talking about finishing school while he was locked up and having "a lot of degrees".  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  Pt also talked about getting an "adopted son" on November the first because that person's parents are dealing with "a lot cancers".  Pt was focused on getting this "adopted son" and his younger brother giving him a car.      Plan: Continue to engage patient in RT group sessions 2-3x/week.   Cathrine Krizan-McCall, LRT,CTRS 06/28/2022 11:03 AM

## 2022-06-28 NOTE — [facility] Group Notes (Signed)
Spirituality group facilitated by Kathrynn Humble, [place].  Group Description: Group focused on topic of community. Patients participated in facilitated discussion around topic, connecting with one another around experiences and definitions for community. Group members engaged with visual explorer photos, reflecting on what community looks like for them today. Group engaged in discussion around how their definitions of community are present today in hospital.  Modalities: Psycho-social ed, Adlerian, Narrative, MI  Patient Progress: Patient was invited to group but did not attend.

## 2022-06-28 NOTE — Progress Notes (Signed)
   06/28/22 1000  Psych Admission Type (Psych Patients Only)  Admission Status Voluntary  Psychosocial Assessment  Patient Complaints Anxiety  Eye Contact Intense  Facial Expression Fixed smile  Affect Euphoric  Speech Logical/coherent  Interaction Attention-seeking;Childlike  Motor Activity Restless  Appearance/Hygiene Bizarre  Behavior Characteristics Agitated;Irritable  Mood Irritable  Aggressive Behavior  Effect No apparent injury  Thought Process  Coherency Disorganized;Flight of ideas  Content Magical thinking  Delusions Referential  Perception WDL  Hallucination Auditory  Judgment Impaired  Confusion Mild  Danger to Self  Current suicidal ideation? Denies  Danger to Others  Danger to Others None reported or observed

## 2022-06-28 NOTE — [facility] Group Notes (Signed)
Adult Psychoeducational Group Note  Date:  06/28/2022 Time:  8:48 PM  Group Topic/Focus:  Wrap-Up Group:   The focus of this group is to help patients review their daily goal of treatment and discuss progress on daily workbooks.  Participation Level:  Active  Participation Quality:  Appropriate and Attentive  Affect:  Appropriate  Cognitive:  Alert and Appropriate  Insight: Appropriate and Good  Engagement in Group:  Engaged  Modes of Intervention:  Discussion and Education  Additional Comments:  Pt attended and participated in wrap up group this evening and rated their day a 10/10. Pt states that they spoke with their aunt today, who is a strong support system for them. Pt anticipates being D/C on 10/24 and states that when they D/C they will continue to take their meds. Pt states that they are not having any AH or VH.   Cristi Loron 06/28/2022, 8:48 PM
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
## 2022-06-28 NOTE — Progress Notes (Signed)
Cooperative, med compliant. No acute distress noted during the shift   06/27/22 2200  Psych Admission Type (Psych Patients Only)  Admission Status Voluntary  Psychosocial Assessment  Patient Complaints Anxiety  Eye Contact Intense  Facial Expression Fixed smile  Affect Euphoric  Speech Logical/coherent  Interaction Attention-seeking;Childlike  Motor Activity Restless  Appearance/Hygiene Bizarre  Behavior Characteristics Cooperative  Mood Euphoric  Aggressive Behavior  Effect No apparent injury  Thought Horticulturist, commercial of ideas  Content Magical thinking  Delusions Referential  Perception WDL  Hallucination Auditory;Visual  Judgment Impaired  Confusion Mild  Danger to Self  Current suicidal ideation? Denies  Danger to Others  Danger to Others None reported or observed

## 2022-06-29 MED ORDER — AQUAPHOR EX OINT
TOPICAL_OINTMENT | Freq: Every day | CUTANEOUS | Status: DC
  Administered 2022-06-29: 1 via TOPICAL
  Filled 2022-06-29: qty 50

## 2022-06-29 NOTE — Progress Notes (Signed)
   06/29/22 1127  Psych Admission Type (Psych Patients Only)  Admission Status Voluntary  Psychosocial Assessment  Patient Complaints Anxiety;Irritability  Eye Contact Intense  Facial Expression Fixed smile  Affect Preoccupied;Euphoric  Speech Soft;Tangential  Interaction Childlike;Attention-seeking;Needy  Motor Activity Fidgety  Appearance/Hygiene Unremarkable  Behavior Characteristics Cooperative  Mood Labile;Preoccupied  Aggressive Behavior  Effect No apparent injury  Thought Horticulturist, commercial of ideas;Disorganized  Content Blaming others;Magical thinking  Delusions Referential  Perception WDL  Hallucination None reported or observed  Judgment Impaired  Confusion Mild  Danger to Self  Current suicidal ideation? Denies  Danger to Others  Danger to Others None reported or observed

## 2022-06-29 NOTE — Group Note (Signed)
  [facility] LCSW Group Therapy Note  Date/Time:  06/29/2022 11:15AM-12:00PM  Type of Therapy and Topic:  Group Therapy:  Feelings About Hospitalization  Participation Level:  Did Not Attend   Description of Group This process group involved patients discussing their feelings related to being hospitalized, as well as the benefits they see to being in the hospital.  These feelings and benefits were itemized.  The group then brainstormed specific ways in which they could seek those same benefits when they discharge and return home.  Therapeutic Goals Patient will identify and describe positive and negative feelings related to hospitalization Patient will verbalize benefits of hospitalization to themselves personally Patients will brainstorm together ways they can obtain similar benefits in the outpatient setting, identify barriers to wellness and possible solutions  Summary of Patient Progress:  Patient was invited to group, did not attend.   Therapeutic Modalities Cognitive Behavioral Therapy Motivational Interviewing    Selmer Dominion, LCSW 06/29/2022, 3:20 PM

## 2022-06-29 NOTE — [facility] Group Notes (Signed)
Pt. Did not attend golds group. 

## 2022-06-29 NOTE — [facility] Group Notes (Signed)
Pt. Attended group and participated.

## 2022-06-29 NOTE — Progress Notes (Signed)
[facility] MD Progress Note  06/29/2022 4:28 PM Alec Williams  MRN:  [id number]  Subjective: Alec Williams states," my mother that is disrespectful and I am tired of living with her.  I want to make my own money and move away from her to live on my own."  Brief history: Alec Williams is a 21 year old, African-American male with a past psychiatric history significant for schizoaffective disorder, major depressive disorder, and self injurious behavior who was involuntarily admitted to [facility] for psychiatric evaluation due to bizarre behavior and delusional thoughts.   Patient was examined by Alan Mulder, NP for reevaluation.  Patient is currently being managed on the following psychiatric medications:   Risperidone 1 mg 2 times daily Risperidone disintegrating tabs 2 mg p.o. daily at bedtime face dose 06/28/2022 Cogentin 0.5 mg 2 times daily Depakote ER 1500 mg p.o. daily at bedtime Guanfacine 1 mg at bedtime Trazodone 50 at bedtime as needed Hydroxyzine 25 mg 3 times daily as needed Risperidone ER SUSY 100 mg subcu every 28 days, first dose Thursday, 06/27/2022 at 1300 Agitation protocol             -Risperidone 2 mg every 8 hours as needed for agitation AND             -Lorazepam 1 mg every 6 hours as needed for agitation AND             -Geodon 20 mg intramuscular injection every 6 hours as needed for agitation  Today's assessment: Patient is examined on [address] sitting on his bed.  He appears sad, labile mood but participating in the examination.  Chart reviewed and findings shared with the treatment team and consult with Dr. Abbott Pao.  Alert and oriented x4, speech clear with normal pattern and volume.  Patient reports that he is feeling down due to his mother wanting him to go to a shelter upon discharge from this facility.  Patient reports that he will start calling shelters when they become open.  Patient reports that he would like to be transferred to home and states that he should  be discharged to his mother due to the form of Medicaid he possesses. Patient requested that he received documents from whenever he was in jail.  Patient seems confusing, during the exam.  He informed this provider to call the mom to come pick him up after discharge.  Taking his medication without complaint of adverse reaction.  Actively attending group activities and therapeutic milieu.  Valproic acid level reviewed noted to be less than 10.  Valproic acid level scheduled to be obtained in a.m., we will follow-up with results.  Patient continues on Depakote ER 1500 mg p.o. daily at bedtime.  Patient denies suicidal ideations stating that now he is very happy.  Patient denies homicidal ideations.  He further denies auditory or visual hallucinations and does not appear to be responding to internal plus external stimuli.  Patient endorses good sleep and receives about 8 hours of sleep last night.  Patient endorses good appetite but states that his appetite is good.  Informed this provider that he needs to change of clothing, and would like the facility to provide him with change of clothing.  Patient appears neat and clean.  Principal Problem: Schizoaffective disorder (HCC) Diagnosis: Principal Problem:   Schizoaffective disorder (HCC) Active Problems:   Major depressive disorder, recurrent, severe with psychotic features (HCC)  Total Time spent with patient: 15 minutes  Past Psychiatric History:  Patient denies having a past  psychiatric history.   [facility] patient had a history of self-injurious behavior and major depressive disorder   Patient has a past history significant for schizoaffective disorder  Past Medical History:  Past Medical History:  Diagnosis Date   Schizophrenia (HCC)    History reviewed. No pertinent surgical history. Family History: History reviewed. No pertinent family history. Family Psychiatric  History: not noted Social  History:  Social History   Substance and Sexual Activity  Alcohol Use No     Social History   Substance and Sexual Activity  Drug Use No    Social History   Socioeconomic History   Marital status: Single    Spouse name: Not on file   Number of children: Not on file   Years of education: Not on file   Highest education level: Not on file  Occupational History   Not on file  Tobacco Use   Smoking status: Former   Smokeless tobacco: Former   Tobacco comments:    "I smoke black and mild"  Vaping Use   Vaping Use: Every day  Substance and Sexual Activity   Alcohol use: No   Drug use: No   Sexual activity: Not on file  Other Topics Concern   Not on file  Social History Narrative   Not on file   Social Determinants of Health   Financial Resource Strain: Not on file  Food Insecurity: No Food Insecurity (06/24/2022)   Hunger Vital Sign    Worried About Running Out of Food in the Last Year: Never true    Ran Out of Food in the Last Year: Never true  Transportation Needs: No Transportation Needs (06/24/2022)   PRAPARE - Administrator, Civil ServiceTransportation    Lack of Transportation (Medical): No    Lack of Transportation (Non-Medical): No  Physical Activity: Not on file  Stress: Not on file  Social Connections: Not on file   Additional Social History:   Sleep: Good  Appetite:  Good  Current Medications: Current Facility-Administered Medications  Medication Dose Route Frequency Provider Last Rate Last Admin   acetaminophen (TYLENOL) tablet 650 mg  650 mg Oral Q6H PRN Cecilie LowersNtuen, Kron Everton C, FNP   650 mg at 06/29/22 0621   alum & mag hydroxide-simeth (MAALOX/MYLANTA) 200-200-20 MG/5ML suspension 30 mL  30 mL Oral Q4H PRN Trinidy Masterson, Jesusita Okaina C, FNP       benztropine (COGENTIN) tablet 0.5 mg  0.5 mg Oral BID Nwoko, Uchenna E, PA   0.5 mg at 06/29/22 0836   divalproex (DEPAKOTE ER) 24 hr tablet 1,500 mg  1,500 mg Oral QHS Massengill, Nathan, MD   1,500 mg at 06/28/22 2043   guanFACINE (TENEX) tablet 1 mg   1 mg Oral QHS Massengill, Nathan, MD   1 mg at 06/28/22 2043   hydrOXYzine (ATARAX) tablet 25 mg  25 mg Oral TID PRN Cecilie LowersNtuen, Campbell Agramonte C, FNP   25 mg at 06/29/22 0838   risperiDONE (RISPERDAL M-TABS) disintegrating tablet 2 mg  2 mg Oral Q8H PRN Massengill, Harrold DonathNathan, MD   2 mg at 06/27/22 2048   And   LORazepam (ATIVAN) tablet 1 mg  1 mg Oral Q6H PRN Massengill, Harrold DonathNathan, MD       And   ziprasidone (GEODON) injection 20 mg  20 mg Intramuscular Q6H PRN Massengill, Nathan, MD       magnesium hydroxide (MILK OF MAGNESIA) suspension 30 mL  30 mL Oral Daily PRN Paton Crum, Jesusita Okaina C, FNP   30 mL at 06/28/22  1158   mineral oil-hydrophilic petrolatum (AQUAPHOR) ointment   Topical Q0600 Starleen Blue, NP   1 Application at 06/29/22 1126   nicotine polacrilex (NICORETTE) gum 2 mg  2 mg Oral PRN Massengill, Harrold Donath, MD   2 mg at 06/29/22 1125   risperiDONE (RISPERDAL M-TABS) disintegrating tablet 1 mg  1 mg Oral Daily Massengill, Nathan, MD   1 mg at 06/29/22 0836   risperiDONE (RISPERDAL M-TABS) disintegrating tablet 2 mg  2 mg Oral QHS Massengill, Nathan, MD   2 mg at 06/28/22 2042   risperiDONE ER SUSY 100 mg  100 mg Subcutaneous Q28 days Massengill, Harrold Donath, MD   100 mg at 06/27/22 1301   traZODone (DESYREL) tablet 50 mg  50 mg Oral QHS PRN Cecilie Lowers, FNP   50 mg at 06/28/22 2043    Lab Results:  No results found for this or any previous visit (from the past 48 hour(s)).   Blood Alcohol level:  Lab Results  Component Value Date   ETH <10 10/19/2019   ETH <10 07/07/2018    Metabolic Disorder Labs: Lab Results  Component Value Date   HGBA1C 4.7 (L) 06/25/2022   MPG 88.19 06/25/2022   MPG 85.32 07/09/2018   Lab Results  Component Value Date   PROLACTIN 22.9 (H) 07/09/2018   PROLACTIN 10.6 12/10/2017   Lab Results  Component Value Date   CHOL 147 06/25/2022   TRIG 70 06/25/2022   HDL 43 06/25/2022   CHOLHDL 3.4 06/25/2022   VLDL 14 06/25/2022   LDLCALC 90 06/25/2022   LDLCALC 79 07/09/2018     Physical Findings: AIMS: Facial and Oral Movements Muscles of Facial Expression: None, normal Lips and Perioral Area: None, normal Jaw: None, normal Tongue: None, normal,Extremity Movements Upper (arms, wrists, hands, fingers): None, normal Lower (legs, knees, ankles, toes): None, normal, Trunk Movements Neck, shoulders, hips: None, normal, Overall Severity Severity of abnormal movements (highest score from questions above): None, normal Incapacitation due to abnormal movements: None, normal Patient's awareness of abnormal movements (rate only patient's report): No Awareness, Dental Status Current problems with teeth and/or dentures?: No Does patient usually wear dentures?: No  CIWA:    COWS:     Musculoskeletal: Strength & Muscle Tone: within normal limits Gait & Station: normal Patient leans: N/A  Psychiatric Specialty Exam:  Presentation  General Appearance:  Appropriate for Environment; Casual  Eye Contact: Good  Speech: Clear and Coherent; Normal Rate  Speech Volume: Normal  Handedness: Right   Mood and Affect  Mood: Depressed  Affect: Congruent   Thought Process  Thought Processes: Coherent  Descriptions of Associations:Intact  Orientation:Full (Time, Place and Person)  Thought Content:Delusions  History of Schizophrenia/Schizoaffective disorder:Yes  Duration of Psychotic Symptoms:Greater than six months  Hallucinations:Hallucinations: None  Ideas of Reference:None  Suicidal Thoughts:Suicidal Thoughts: No  Homicidal Thoughts:Homicidal Thoughts: No   Sensorium  Memory: Immediate Fair; Recent Fair; Remote Fair  Judgment: Fair  Insight: Fair   Art therapist  Concentration: Good  Attention Span: Good  Recall: Fair  Fund of Knowledge: Fair  Language: Good  Psychomotor Activity  Psychomotor Activity: Psychomotor Activity: Normal  Assets  Assets: Communication Skills; Social Support; Physical  Health  Sleep  Sleep: Sleep: Good Number of Hours of Sleep: 8    Physical Exam: Physical Exam Vitals and nursing note reviewed.  Constitutional:      Appearance: Normal appearance.  HENT:     Head: Normocephalic and atraumatic.     Right Ear: External ear normal.  Left Ear: External ear normal.     Nose: Nose normal.     Mouth/Throat:     Mouth: Mucous membranes are moist.  Eyes:     Extraocular Movements: Extraocular movements intact.     Conjunctiva/sclera: Conjunctivae normal.     Pupils: Pupils are equal, round, and reactive to light.  Cardiovascular:     Rate and Rhythm: Normal rate and regular rhythm.     Pulses: Normal pulses.  Pulmonary:     Effort: Pulmonary effort is normal.     Breath sounds: Normal breath sounds.  Abdominal:     General: Abdomen is flat.  Genitourinary:    Comments: Deferred Musculoskeletal:        General: Normal range of motion.     Cervical back: Normal range of motion and neck supple.  Skin:    General: Skin is warm and dry.  Neurological:     General: No focal deficit present.     Mental Status: He is alert and oriented to person, place, and time.  Psychiatric:        Attention and Perception: Attention and perception normal. He does not perceive auditory or visual hallucinations.        Mood and Affect: Affect normal. Mood is depressed.        Speech: Speech normal.        Behavior: Behavior normal. Behavior is cooperative.        Thought Content: Thought content is delusional. Thought content is not paranoid. Thought content does not include homicidal or suicidal ideation.        Cognition and Memory: Cognition and memory normal.        Judgment: Judgment normal.    Review of Systems  Constitutional: Negative.  Negative for chills and fever.  HENT: Negative.  Negative for hearing loss and tinnitus.   Eyes: Negative.  Negative for blurred vision and double vision.  Respiratory: Negative.  Negative for cough, sputum  production, shortness of breath and wheezing.   Cardiovascular: Negative.  Negative for chest pain and palpitations.  Gastrointestinal: Negative.  Negative for heartburn, nausea and vomiting.  Genitourinary: Negative.  Negative for dysuria, frequency and urgency.  Musculoskeletal: Negative.  Negative for back pain, myalgias and neck pain.  Skin: Negative.  Negative for itching and rash.  Neurological: Negative.  Negative for dizziness, tingling and headaches.  Endo/Heme/Allergies: Negative.  Negative for environmental allergies and polydipsia. Does not bruise/bleed easily.  Psychiatric/Behavioral:  Positive for depression. Negative for hallucinations, substance abuse and suicidal ideas. The patient is not nervous/anxious and does not have insomnia.    Blood pressure 105/73, pulse 78, temperature 98.1 F (36.7 C), temperature source Oral, resp. rate 16, height 5\' 4"  (1.626 m), weight 72.6 kg, SpO2 99 %. Body mass index is 27.46 kg/m.  Treatment Plan Summary: Daily contact with patient to assess and evaluate symptoms and progress in treatment and Medication management  Plan:   Diagnosis: Principal Problem:   Schizoaffective disorder (HCC) Active Problems:   Major depressive disorder, recurrent, severe with psychotic features (HCC)   #Major depressive disorder, recurrent, severe with psychotic features #Schizoaffective disorder -Risperidone ER SUSY 100 mg subcutaneous injection has been administered for mood stabilization -Adjust oral risperidone dose to 1 mg daily/2 mg at bedtime disintegrating tablet for mood stabilization -Continue Cogentin 0.5 mg 2 times daily for management of his extrapyramidal symptoms from antipsychotic use -Adjust Depakote DR to Depakote ER 1500 mg at bedtime for mood stabilization -Continue guanfacine 1 mg at  bedtime for agitation -Agitation protocol             -Risperidone 2 mg every 8 hours as needed for agitation AND             -Lorazepam 1 mg every 6  hours as needed for agitation AND             -Ziprasidone 20 mg intramuscular injection every 6 hours for agitation     As needed medications: -Patient to continue taking Tylenol 650 mg every 6 hours as needed for mild pain -Patient to continue taking Maalox/Mylanta 30 mL every 4 hours as needed for indigestion -Patient to continue taking Milk of Magnesia 30 mL as needed for mild constipation -Hydroxyzine 25 mg 3 times daily as needed for anxiety -Trazodone 50 mg at bedtime as needed for insomnia  -Nicotine polacrilex 2 mg as needed for smoking cessation   Discharge Planning: Social work and case management to assist with discharge planning and identification of hospital follow-up needs prior to discharge Estimated LOS: 5-7 days Discharge Concerns: Need to establish a safety plan; Medication compliance and effectiveness Discharge Goals: Return home with outpatient referrals for mental health follow-up including medication management/psychotherapy    I certify that inpatient services furnished can reasonably be expected to improve the patient's condition.    Laretta Bolster, FNP 06/29/2022, 4:28 PMPatient ID: Alec Williams, male   DOB: Sep 05, 2001, 21 y.o.   MRN: [id number]

## 2022-06-29 NOTE — Progress Notes (Signed)
   06/28/22 2100  Psych Admission Type (Psych Patients Only)  Admission Status Voluntary  Psychosocial Assessment  Patient Complaints Anxiety  Eye Contact Intense;Glaring  Facial Expression Fixed smile  Affect Euphoric  Speech Logical/coherent  Interaction Attention-seeking;Childlike  Motor Activity Fidgety  Appearance/Hygiene Unremarkable  Behavior Characteristics Cooperative;Calm  Mood Pleasant  Thought Process  Coherency Disorganized;Flight of ideas  Content Blaming others  Delusions Referential  Perception WDL  Hallucination None reported or observed  Judgment Impaired  Confusion Mild  Danger to Self  Current suicidal ideation? Denies  Danger to Others  Danger to Others None reported or observed

## 2022-06-30 DIAGNOSIS — F250 Schizoaffective disorder, bipolar type: Secondary | ICD-10-CM

## 2022-06-30 LAB — HEPATIC FUNCTION PANEL
ALT: 32 U/L (ref 0–44)
AST: 16 U/L (ref 15–41)
Albumin: 4.4 g/dL (ref 3.5–5.0)
Alkaline Phosphatase: 71 U/L (ref 38–126)
Bilirubin, Direct: 0.1 mg/dL (ref 0.0–0.2)
Indirect Bilirubin: 0.4 mg/dL (ref 0.3–0.9)
Total Bilirubin: 0.5 mg/dL (ref 0.3–1.2)
Total Protein: 6.8 g/dL (ref 6.5–8.1)

## 2022-06-30 LAB — VALPROIC ACID LEVEL: Valproic Acid Lvl: 35 ug/mL — ABNORMAL LOW (ref 50.0–100.0)

## 2022-06-30 MED ORDER — RISPERIDONE 2 MG PO TBDP
2.0000 mg | ORAL_TABLET | Freq: Every day | ORAL | Status: DC
  Administered 2022-07-01 – 2022-07-02 (×2): 2 mg via ORAL
  Filled 2022-06-30 (×5): qty 1

## 2022-06-30 NOTE — Progress Notes (Signed)
   06/30/22 2200  Psych Admission Type (Psych Patients Only)  Admission Status Voluntary  Psychosocial Assessment  Patient Complaints Anxiety;Sadness  Eye Contact Intense  Facial Expression Pinched  Affect Irritable;Preoccupied  Speech Argumentative  Interaction Attention-seeking;Demanding  Motor Activity Slow  Appearance/Hygiene Unremarkable  Behavior Characteristics Irritable  Mood Depressed;Anxious  Thought Process  Coherency Circumstantial  Content Blaming others  Delusions Paranoid  Perception WDL  Hallucination None reported or observed  Judgment Impaired  Confusion WDL  Danger to Self  Current suicidal ideation? Denies  Danger to Others  Danger to Others None reported or observed

## 2022-06-30 NOTE — [facility] Group Notes (Signed)
Did not attend golds group.

## 2022-06-30 NOTE — Group Note (Signed)
LCSW Group Therapy Note  06/30/2022      Type of Therapy and Topic:  Group Therapy: Gratitude  Participation Level:  Did Not Attend   Description of Group:   In this group, patients shared and discussed the importance of acknowledging the elements in their lives for which they are grateful and how this can positively impact their mood.  The group discussed how bringing the positive elements of their lives to the forefront of their minds can help with recovery from any illness, physical or mental.  An exercise was done as a group in which a list was made of gratitude items in order to encourage participants to consider other potential positives in their lives.  Therapeutic Goals: Patients will identify one or more item for which they are grateful in each of 6 categories:  people, experiences, things, places, skills, and other. Patients will discuss how it is possible to seek out gratitude in even bad situations. Patients will explore other possible items of gratitude that they could remember.   Summary of Patient Progress:  Patient was invited to group, did not attend.   Therapeutic Modalities:   Solution-Focused Therapy Activity  Buelah Rennie J Grossman-Orr, LCSW .  

## 2022-06-30 NOTE — Progress Notes (Signed)
   06/29/22 2000  Psych Admission Type (Psych Patients Only)  Admission Status Voluntary  Psychosocial Assessment  Patient Complaints Anxiety  Eye Contact Intense  Facial Expression Fixed smile  Affect Euphoric  Speech Tangential  Interaction Attention-seeking  Motor Activity Fidgety  Appearance/Hygiene Unremarkable  Behavior Characteristics Appropriate to situation  Mood Elated  Thought Process  Coherency Flight of ideas;Disorganized;Tangential  Content Magical thinking  Delusions Referential  Perception WDL  Hallucination None reported or observed  Judgment Impaired  Confusion Mild  Danger to Self  Current suicidal ideation? Denies  Danger to Others  Danger to Others None reported or observed

## 2022-06-30 NOTE — Progress Notes (Signed)
Adult Psychoeducational Group Note  Date:  06/30/2022 Time:  8:33 PM  Group Topic/Focus:  Wrap-Up Group:   The focus of this group is to help patients review their daily goal of treatment and discuss progress on daily workbooks.  Participation Level:  Minimal  Participation Quality:  Inattentive  Affect:  Resistant  Cognitive:  Lacking  Insight: Lacking  Engagement in Group:  Lacking and Limited  Modes of Intervention:  Discussion  Additional Comments:   Pt came into group and sat in the corner. Pt states that he has had a rough day. Pt was smiling and in good spirits during shift change, but issue with nursing staff seemed to have altered his mood. Pt endorses anxiety and depression for the past 2 years. Pt states that one of his goals was to communicate more and be open to his peers and staffing.   Alec Williams 06/30/2022, 8:33 PM

## 2022-06-30 NOTE — Progress Notes (Signed)
[facility] MD Progress Note  06/30/2022 12:39 PM Alec Williams  MRN:  [id number]  Subjective: Alec Williams states," My caseworker visited yesterday from social services and she gave me her number. I need to see the social worker here because I want to be my own payee rather than [place] being my payee."  Brief history: Alec Williams is a 21 year old, African-American male with a past psychiatric history significant for schizoaffective disorder, major depressive disorder, and self injurious behavior who was involuntarily admitted to [facility] for psychiatric evaluation due to bizarre behavior and delusional thoughts.   Patient was examined by [name], NP for reevaluation.  Patient is currently being managed on the following psychiatric medications:   Risperidone 1 mg 2 times daily. Changed to 2 mg PO daily 06/30/22 Risperidone disintegrating tabs 2 mg p.o. daily at bedtime face dose 06/28/2022 Cogentin 0.5 mg 2 times daily Depakote ER 1500 mg p.o. daily at bedtime Guanfacine 1 mg at bedtime Trazodone 50 at bedtime as needed Hydroxyzine 25 mg 3 times daily as needed Risperidone ER SUSY 100 mg subcu every 28 days, first dose Thursday, 06/27/2022 at 1300 Agitation protocol             -Risperidone 2 mg every 8 hours as needed for agitation AND             -Lorazepam 1 mg every 6 hours as needed for agitation AND             -Geodon 20 mg intramuscular injection every 6 hours as needed for agitation  Today's assessment: On assessment today, patient is examined on [address] sitting on his bed.  He appears elated but with labile mood and participating in the examination.  Chart reviewed and findings shared with the treatment team and consult with Dr. Winfred Leeds.  Alert and oriented x4, speech clear with normal pattern and volume.  Patient reports that he was visited by his caseworker from social services yesterday, who provided him with her phone number.  Informed this provider that he wants to be his  own payee because his Medicaid card is coming in the mail in November 2023.  Reports that he does not want [facility] to be his payee anymore.  With this, he plans to be receiving his disability payment directly by himself.  Not sure how reliable this information is, or could result from patient psychosis. Made patient aware that social worker will be available to address his concerns.    Nursing staff also reports that patient attempted to refuse his a.m. medication, but later changed his mind.  Risperidone 1 mg daily, increase to 2 mg p.o. daily.  Continues with other scheduled medication without noted EPS, stiffness,  bradykinesia, mask-like face, cogwheel rigidity,or tremors. Patient continues to take his medications without further complaint.  Encouraged to attend group activities and therapeutic milieu.  Valproic acid level reviewed noted to be less than 10.  Valproic acid level scheduled to be obtained this a.m. was not obtained due to patient being at breakfast in the cafeteria.  Lab technician called, made this provider aware that she would be at [facility] at 6 PM to obtain these scheduled labs.  Patient continues on Depakote ER 1500 mg p.o. daily at bedtime.  Patient denies suicidal ideations and homicidal ideations.  He further denies auditory or visual hallucinations and does not appear to be responding to internal plus external stimuli.  Patient endorses good sleep of over 8 hours of last night.  Patient endorses good appetite.  Report less depressed mood and reduced anxiety.  Principal Problem: Schizoaffective disorder ([place]) Diagnosis: Principal Problem:   Schizoaffective disorder ([place]) Active Problems:   Major depressive disorder, recurrent, severe with psychotic features ([place])  Total Time spent with patient: 15 minutes  Past Psychiatric History:  Patient denies having a past psychiatric history.   Per patient's previous [facility] admission (07/07/2018), patient had a history of self-injurious behavior  and major depressive disorder   Patient has a past history significant for schizoaffective disorder  Past Medical History:  Past Medical History:  Diagnosis Date   Schizophrenia ([place])    History reviewed. No pertinent surgical history. Family History: History reviewed. No pertinent family history. Family Psychiatric  History: not noted Social History:  Social History   Substance and Sexual Activity  Alcohol Use No     Social History   Substance and Sexual Activity  Drug Use No    Social History   Socioeconomic History   Marital status: Single    Spouse name: Not on file   Number of children: Not on file   Years of education: Not on file   Highest education level: Not on file  Occupational History   Not on file  Tobacco Use   Smoking status: Former   Smokeless tobacco: Former   Tobacco comments:    "I smoke black and mild"  Vaping Use   Vaping Use: Every day  Substance and Sexual Activity   Alcohol use: No   Drug use: No   Sexual activity: Not on file  Other Topics Concern   Not on file  Social History Narrative   Not on file   Social Determinants of Health   Financial Resource Strain: Not on file  Food Insecurity: No Food Insecurity (06/24/2022)   Hunger Vital Sign    Worried About Running Out of Food in the Last Year: Never true    Ran Out of Food in the Last Year: Never true  Transportation Needs: No Transportation Needs (06/24/2022)   PRAPARE - Hydrologist (Medical): No    Lack of Transportation (Non-Medical): No  Physical Activity: Not on file  Stress: Not on file  Social Connections: Not on file   Additional Social History:   Sleep: Good  Appetite:  Good  Current Medications: Current Facility-Administered Medications  Medication Dose Route Frequency Provider Last Rate Last Admin   acetaminophen (TYLENOL) tablet 650 mg  650 mg Oral Q6H PRN Anagha Loseke, Kris Hartmann, FNP   650 mg at 06/30/22 1145   alum & mag  hydroxide-simeth (MAALOX/MYLANTA) 200-200-20 MG/5ML suspension 30 mL  30 mL Oral Q4H PRN Glorie Dowlen, Kris Hartmann, FNP       benztropine (COGENTIN) tablet 0.5 mg  0.5 mg Oral BID Nwoko, Uchenna E, PA   0.5 mg at 06/30/22 0802   divalproex (DEPAKOTE ER) 24 hr tablet 1,500 mg  1,500 mg Oral QHS Massengill, Ovid Curd, MD   1,500 mg at 06/29/22 2047   guanFACINE (TENEX) tablet 1 mg  1 mg Oral QHS Massengill, Nathan, MD   1 mg at 06/29/22 2053   hydrOXYzine (ATARAX) tablet 25 mg  25 mg Oral TID PRN Laretta Bolster, FNP   25 mg at 06/29/22 0838   risperiDONE (RISPERDAL M-TABS) disintegrating tablet 2 mg  2 mg Oral Q8H PRN Janine Limbo, MD   2 mg at 06/27/22 2048   And   LORazepam (ATIVAN) tablet 1 mg  1 mg Oral Q6H PRN Janine Limbo, MD  And   ziprasidone (GEODON) injection 20 mg  20 mg Intramuscular Q6H PRN Massengill, Nathan, MD       magnesium hydroxide (MILK OF MAGNESIA) suspension 30 mL  30 mL Oral Daily PRN Laretta Bolster, FNP   30 mL at 06/30/22 [id number]   mineral oil-hydrophilic petrolatum (AQUAPHOR) ointment   Topical Q0600 Nicholes Rough, NP   Given at 06/30/22 [id number]   nicotine polacrilex (NICORETTE) gum 2 mg  2 mg Oral PRN Massengill, Ovid Curd, MD   2 mg at 06/30/22 1013   risperiDONE (RISPERDAL M-TABS) disintegrating tablet 1 mg  1 mg Oral Daily Massengill, Nathan, MD   1 mg at 06/30/22 0802   risperiDONE (RISPERDAL M-TABS) disintegrating tablet 2 mg  2 mg Oral QHS Massengill, Nathan, MD   2 mg at 06/29/22 2047   risperiDONE ER SUSY 100 mg  100 mg Subcutaneous Q28 days Massengill, Ovid Curd, MD   100 mg at 06/27/22 1301   traZODone (DESYREL) tablet 50 mg  50 mg Oral QHS PRN Laretta Bolster, FNP   50 mg at 06/28/22 2043    Lab Results:  No results found for this or any previous visit (from the past 66 hour(s)).   Blood Alcohol level:  Lab Results  Component Value Date   ETH <10 10/19/2019   ETH <10 0000000    Metabolic Disorder Labs: Lab Results  Component Value Date   HGBA1C 4.7 (L)  06/25/2022   MPG 88.19 06/25/2022   MPG 85.32 07/09/2018   Lab Results  Component Value Date   PROLACTIN 22.9 (H) 07/09/2018   PROLACTIN 10.6 12/10/2017   Lab Results  Component Value Date   CHOL 147 06/25/2022   TRIG 70 06/25/2022   HDL 43 06/25/2022   CHOLHDL 3.4 06/25/2022   VLDL 14 06/25/2022   LDLCALC 90 06/25/2022   LDLCALC 79 07/09/2018    Physical Findings: AIMS: Facial and Oral Movements Muscles of Facial Expression: None, normal Lips and Perioral Area: None, normal Jaw: None, normal Tongue: None, normal,Extremity Movements Upper (arms, wrists, hands, fingers): None, normal Lower (legs, knees, ankles, toes): None, normal, Trunk Movements Neck, shoulders, hips: None, normal, Overall Severity Severity of abnormal movements (highest score from questions above): None, normal Incapacitation due to abnormal movements: None, normal Patient's awareness of abnormal movements (rate only patient's report): No Awareness, Dental Status Current problems with teeth and/or dentures?: No Does patient usually wear dentures?: No  CIWA:    COWS:     Musculoskeletal: Strength & Muscle Tone: within normal limits Gait & Station: normal Patient leans: N/A  Psychiatric Specialty Exam:  Presentation  General Appearance:  Appropriate for Environment; Casual; Fairly Groomed  Eye Contact: Good  Speech: Clear and Coherent  Speech Volume: Normal  Handedness: Right  Mood and Affect  Mood: Labile  Affect: Congruent  Thought Process  Thought Processes: Coherent  Descriptions of Associations:Intact  Orientation:Full (Time, Place and Person)  Thought Content:Delusions  History of Schizophrenia/Schizoaffective disorder:Yes  Duration of Psychotic Symptoms:Greater than six months  Hallucinations:Hallucinations: None  Ideas of Reference:None  Suicidal Thoughts:Suicidal Thoughts: No  Homicidal Thoughts:Homicidal Thoughts: No   Sensorium  Memory: Immediate  Fair; Recent Fair; Remote Fair  Judgment: Fair  Insight: Fair  Community education officer  Concentration: Good  Attention Span: Good  Recall: Fair  Fund of Knowledge: Fair  Language: Good  Psychomotor Activity  Psychomotor Activity: Psychomotor Activity: Normal  Assets  Assets: Communication Skills; Physical Health; Social Support  Sleep  Sleep: Sleep: Good Number of Hours of  Sleep: 8  Physical Exam: Physical Exam Vitals and nursing note reviewed.  Constitutional:      Appearance: Normal appearance.  HENT:     Head: Normocephalic and atraumatic.     Right Ear: External ear normal.     Left Ear: External ear normal.     Nose: Nose normal.     Mouth/Throat:     Mouth: Mucous membranes are moist.  Eyes:     Extraocular Movements: Extraocular movements intact.     Conjunctiva/sclera: Conjunctivae normal.     Pupils: Pupils are equal, round, and reactive to light.  Cardiovascular:     Rate and Rhythm: Normal rate and regular rhythm.     Pulses: Normal pulses.  Pulmonary:     Effort: Pulmonary effort is normal.     Breath sounds: Normal breath sounds.  Abdominal:     General: Abdomen is flat.  Genitourinary:    Comments: Deferred Musculoskeletal:        General: Normal range of motion.     Cervical back: Normal range of motion and neck supple.  Skin:    General: Skin is warm and dry.  Neurological:     General: No focal deficit present.     Mental Status: He is alert and oriented to person, place, and time.  Psychiatric:        Attention and Perception: Attention and perception normal. He does not perceive auditory or visual hallucinations.        Mood and Affect: Affect normal. Mood is depressed.        Speech: Speech normal.        Behavior: Behavior normal. Behavior is cooperative.        Thought Content: Thought content is delusional. Thought content is not paranoid. Thought content does not include homicidal or suicidal ideation.        Cognition  and Memory: Cognition and memory normal.        Judgment: Judgment normal.   Review of Systems  Constitutional: Negative.  Negative for chills and fever.  HENT: Negative.  Negative for hearing loss and tinnitus.   Eyes: Negative.  Negative for blurred vision and double vision.  Respiratory: Negative.  Negative for cough, sputum production, shortness of breath and wheezing.   Cardiovascular: Negative.  Negative for chest pain and palpitations.  Gastrointestinal: Negative.  Negative for heartburn, nausea and vomiting.  Genitourinary: Negative.  Negative for dysuria, frequency and urgency.  Musculoskeletal: Negative.  Negative for back pain, myalgias and neck pain.  Skin: Negative.  Negative for itching and rash.  Neurological: Negative.  Negative for dizziness, tingling and headaches.  Endo/Heme/Allergies: Negative.  Negative for environmental allergies and polydipsia. Does not bruise/bleed easily.  Psychiatric/Behavioral:  Positive for depression. Negative for hallucinations, substance abuse and suicidal ideas. The patient is not nervous/anxious and does not have insomnia.    Blood pressure 119/64, pulse 95, temperature 98.1 F (36.7 C), temperature source Oral, resp. rate 16, height 5\' 4"  (1.626 m), weight 72.6 kg, SpO2 100 %. Body mass index is 27.46 kg/m.  Treatment Plan Summary: Daily contact with patient to assess and evaluate symptoms and progress in treatment and Medication management  Plan:   Diagnosis: Principal Problem:   Schizoaffective disorder ([place]) Active Problems:   Major depressive disorder, recurrent, severe with psychotic features ([place])   #Major depressive disorder, recurrent, severe with psychotic features #Schizoaffective disorder -Risperidone ER SUSY 100 mg subcutaneous injection has been administered for mood stabilization -Adjust oral risperidone dose to 1  mg daily/2 mg at bedtime disintegrating tablet for mood stabilization -Continue Cogentin 0.5 mg 2 times  daily for management of his extrapyramidal symptoms from antipsychotic use -Adjust Depakote DR to Depakote ER 1500 mg at bedtime for mood stabilization -Continue guanfacine 1 mg at bedtime for agitation -Agitation protocol             -Risperidone 2 mg every 8 hours as needed for agitation AND             -Lorazepam 1 mg every 6 hours as needed for agitation AND             -Ziprasidone 20 mg intramuscular injection every 6 hours for agitation     As needed medications: -Patient to continue taking Tylenol 650 mg every 6 hours as needed for mild pain -Patient to continue taking Maalox/Mylanta 30 mL every 4 hours as needed for indigestion -Patient to continue taking Milk of Magnesia 30 mL as needed for mild constipation -Hydroxyzine 25 mg 3 times daily as needed for anxiety -Trazodone 50 mg at bedtime as needed for insomnia  -Nicotine polacrilex 2 mg as needed for smoking cessation   Discharge Planning: Social work and case management to assist with discharge planning and identification of hospital follow-up needs prior to discharge Estimated LOS: 5-7 days Discharge Concerns: Need to establish a safety plan; Medication compliance and effectiveness Discharge Goals: Return home with outpatient referrals for mental health follow-up including medication management/psychotherapy    I certify that inpatient services furnished can reasonably be expected to improve the patient's condition.    Laretta Bolster, FNP 06/30/2022, 12:39 PMPatient ID: Alec Williams, male   DOB: [birth year], 21 y.o.   MRN: [id number] Patient ID: Aruther Herford, male   DOB: [birth year], 21 y.o.   MRN: [id number]

## 2022-07-01 ENCOUNTER — Encounter (HOSPITAL_COMMUNITY): Payer: Self-pay

## 2022-07-01 DIAGNOSIS — F259 Schizoaffective disorder, unspecified: Secondary | ICD-10-CM

## 2022-07-01 DIAGNOSIS — F333 Major depressive disorder, recurrent, severe with psychotic symptoms: Secondary | ICD-10-CM

## 2022-07-01 MED ORDER — MEDERMA EX GEL
Freq: Every day | CUTANEOUS | Status: DC
  Administered 2022-07-02: 1 via TOPICAL
  Filled 2022-07-01: qty 20

## 2022-07-01 MED ORDER — DIVALPROEX SODIUM ER 500 MG PO TB24
2000.0000 mg | ORAL_TABLET | Freq: Every day | ORAL | Status: DC
  Administered 2022-07-01: 2000 mg via ORAL
  Filled 2022-07-01 (×3): qty 4

## 2022-07-01 NOTE — BH IP Treatment Plan (Signed)
Interdisciplinary Treatment and Diagnostic Plan Update  07/01/2022 Time of Session: update Alec Williams MRN: [id number]  Principal Diagnosis: Schizoaffective disorder [facility])  Secondary Diagnoses: Principal Problem:   Schizoaffective disorder (HCC) Active Problems:   Major depressive disorder, recurrent, severe with psychotic features (HCC)   Current Medications:  Current Facility-Administered Medications  Medication Dose Route Frequency Provider Last Rate Last Admin   acetaminophen (TYLENOL) tablet 650 mg  650 mg Oral Q6H PRN Cecilie Lowers, FNP   650 mg at 07/01/22 0910   alum & mag hydroxide-simeth (MAALOX/MYLANTA) 200-200-20 MG/5ML suspension 30 mL  30 mL Oral Q4H PRN Ntuen, Jesusita Oka, FNP       benztropine (COGENTIN) tablet 0.5 mg  0.5 mg Oral BID Nwoko, Uchenna E, PA   0.5 mg at 07/01/22 0813   divalproex (DEPAKOTE ER) 24 hr tablet 2,000 mg  2,000 mg Oral QHS Nwoko, Uchenna E, PA       guanFACINE (TENEX) tablet 1 mg  1 mg Oral QHS Massengill, Nathan, MD   1 mg at 06/30/22 2043   hydrOXYzine (ATARAX) tablet 25 mg  25 mg Oral TID PRN Cecilie Lowers, FNP   25 mg at 07/01/22 0910   risperiDONE (RISPERDAL M-TABS) disintegrating tablet 2 mg  2 mg Oral Q8H PRN Massengill, Harrold Donath, MD   2 mg at 06/27/22 2048   And   LORazepam (ATIVAN) tablet 1 mg  1 mg Oral Q6H PRN Massengill, Harrold Donath, MD       And   ziprasidone (GEODON) injection 20 mg  20 mg Intramuscular Q6H PRN Massengill, Nathan, MD       magnesium hydroxide (MILK OF MAGNESIA) suspension 30 mL  30 mL Oral Daily PRN Ntuen, Jesusita Oka, FNP   30 mL at 06/30/22 3094   Mederma gel   Topical Daily Green, Terri L, RPH       mineral oil-hydrophilic petrolatum (AQUAPHOR) ointment   Topical Q0600 Starleen Blue, NP   Given at 07/01/22 0768   nicotine polacrilex (NICORETTE) gum 2 mg  2 mg Oral PRN Phineas Inches, MD   2 mg at 07/01/22 0834   risperiDONE (RISPERDAL M-TABS) disintegrating tablet 2 mg  2 mg Oral QHS Massengill, Nathan, MD   2 mg at  06/30/22 2042   risperiDONE (RISPERDAL M-TABS) disintegrating tablet 2 mg  2 mg Oral Daily Ntuen, Tina C, FNP   2 mg at 07/01/22 0881   risperiDONE ER SUSY 100 mg  100 mg Subcutaneous Q28 days Massengill, Harrold Donath, MD   100 mg at 06/27/22 1301   traZODone (DESYREL) tablet 50 mg  50 mg Oral QHS PRN Cecilie Lowers, FNP   50 mg at 06/28/22 2043   PTA Medications: Medications Prior to Admission  Medication Sig Dispense Refill Last Dose   benztropine (COGENTIN) 0.5 MG tablet Take 1 tablet (0.5 mg total) by mouth 2 (two) times daily. For prevention of drug induced tremors (Patient not taking: Reported on 10/19/2019) 60 tablet 0    hydrOXYzine (ATARAX/VISTARIL) 50 MG tablet Take 1 tablet (50 mg total) by mouth every 6 (six) hours as needed for anxiety. (Patient not taking: Reported on 10/19/2019) 60 tablet 0    risperiDONE (RISPERDAL) 3 MG tablet Take 1 tablet (3 mg total) by mouth 2 (two) times daily. For mood control 30 tablet 0    traZODone (DESYREL) 50 MG tablet Take 1 tablet (50 mg total) by mouth at bedtime as needed for sleep. (Patient not taking: Reported on 10/19/2019) 30 tablet 0  Patient Stressors: Legal issue    Patient Strengths: Electronics engineer   Treatment Modalities: Medication Management, Group therapy, Case management,  1 to 1 session with clinician, Psychoeducation, Recreational therapy.   Physician Treatment Plan for Primary Diagnosis: Schizoaffective disorder ([place]) Long Term Goal(s): Improvement in symptoms so as ready for discharge   Short Term Goals: Ability to identify changes in lifestyle to reduce recurrence of condition will improve Ability to verbalize feelings will improve Ability to disclose and discuss suicidal ideas Ability to demonstrate self-control will improve Ability to identify and develop effective coping behaviors will improve Ability to maintain clinical measurements within normal limits will improve Compliance with prescribed  medications will improve Ability to identify triggers associated with substance abuse/mental health issues will improve  Medication Management: Evaluate patient's response, side effects, and tolerance of medication regimen.  Therapeutic Interventions: 1 to 1 sessions, Unit Group sessions and Medication administration.  Evaluation of Outcomes: Progressing  Physician Treatment Plan for Secondary Diagnosis: Principal Problem:   Schizoaffective disorder ([place]) Active Problems:   Major depressive disorder, recurrent, severe with psychotic features ([place])  Long Term Goal(s): Improvement in symptoms so as ready for discharge   Short Term Goals: Ability to identify changes in lifestyle to reduce recurrence of condition will improve Ability to verbalize feelings will improve Ability to disclose and discuss suicidal ideas Ability to demonstrate self-control will improve Ability to identify and develop effective coping behaviors will improve Ability to maintain clinical measurements within normal limits will improve Compliance with prescribed medications will improve Ability to identify triggers associated with substance abuse/mental health issues will improve     Medication Management: Evaluate patient's response, side effects, and tolerance of medication regimen.  Therapeutic Interventions: 1 to 1 sessions, Unit Group sessions and Medication administration.  Evaluation of Outcomes: Progressing   RN Treatment Plan for Primary Diagnosis: Schizoaffective disorder ([place]) Long Term Goal(s): Knowledge of disease and therapeutic regimen to maintain health will improve  Short Term Goals: Ability to remain free from injury will improve, Ability to verbalize frustration and anger appropriately will improve, Ability to demonstrate self-control, Ability to participate in decision making will improve, Ability to verbalize feelings will improve, Ability to disclose and discuss suicidal ideas, Ability to  identify and develop effective coping behaviors will improve, and Compliance with prescribed medications will improve  Medication Management: RN will administer medications as ordered by provider, will assess and evaluate patient's response and provide education to patient for prescribed medication. RN will report any adverse and/or side effects to prescribing provider.  Therapeutic Interventions: 1 on 1 counseling sessions, Psychoeducation, Medication administration, Evaluate responses to treatment, Monitor vital signs and CBGs as ordered, Perform/monitor CIWA, COWS, AIMS and Fall Risk screenings as ordered, Perform wound care treatments as ordered.  Evaluation of Outcomes: Progressing   LCSW Treatment Plan for Primary Diagnosis: Schizoaffective disorder ([place]) Long Term Goal(s): Safe transition to appropriate next level of care at discharge, Engage patient in therapeutic group addressing interpersonal concerns.  Short Term Goals: Engage patient in aftercare planning with referrals and resources, Increase social support, Increase ability to appropriately verbalize feelings, Increase emotional regulation, Facilitate acceptance of mental health diagnosis and concerns, Facilitate patient progression through stages of change regarding substance use diagnoses and concerns, Identify triggers associated with mental health/substance abuse issues, and Increase skills for wellness and recovery  Therapeutic Interventions: Assess for all discharge needs, 1 to 1 time with Social worker, Explore available resources and support systems, Assess for adequacy in community support network,  Educate family and significant other(s) on suicide prevention, Complete Psychosocial Assessment, Interpersonal group therapy.  Evaluation of Outcomes: Progressing   Progress in Treatment: Attending groups: Yes. Participating in groups: Yes. Taking medication as prescribed: Yes. Toleration medication: Yes. Family/Significant  other contact made: Yes, individual(s) contacted:  mother Sharl Ma (mother) [phone] Patient understands diagnosis: No. Discussing patient identified problems/goals with staff: Yes. Medical problems stabilized or resolved: Yes. Denies suicidal/homicidal ideation: Yes. Issues/concerns per patient self-inventory: No.   New problem(s) identified: No, Describe:  none reported  New Short Term/Long Term Goal(s):   medication stabilization, elimination of SI thoughts, development of comprehensive mental wellness plan.    Patient Goals:  Pt continues to work on goals stated at initial treatment team. CSW will continue to work with him regarding his goals and answer any questions that arise.   Discharge Plan or Barriers: Patient homeless.  Patient would like group home placement but does not receive disability benefits at this time.   Reason for Continuation of Hospitalization: Anxiety Delusions  Depression Mania Medication stabilization  Estimated Length of Stay: 5-10 days  Last 3 [place] Suicide Severity Risk Score: Flowsheet Row Admission (Current) [facility]/2019 [facility] Ad[facility] (Discharged[facility]  C-SSRS RISK CATEGORY No Risk No Risk No Risk       Last PHQ 2/9 Scores:     No data to display          Scribe for Treatment Team: Beatris Si, LCSW 07/01/2022 3:32 PM

## 2022-07-01 NOTE — Group Note (Signed)
ype of Therapy and Topic:  Group Therapy:  Cycle of Depression   Participation Level:  Did not attend   Description of Group:  Patients in this group were introduced to the idea of the cycle of depression. Patients explored how stressors can trigger thoughts, feels and physical symptoms that can make you behave in ways that increase symptoms of depression.  Patient identified specific stressors that have triggered depression and explored thoughts that they have about themselves that may not always be true.   Patients encouraged to come up with positive changes and interventions put in place to stop cycle of depression. Patients also participated in discussion about benefits of being able to identify stressors, thoughts, feels and behavioral responses when depressed.      Therapeutic Goals:               1)  To discuss the positive and negative impacts of depressive feels             2)  identify signs and symptoms of depression             3)  discuss alternative behaviors to stop cycle of depression             4)  offer mutual support to others regarding depression             5)  Developing plans for ways to manage specific stressors upon discharge               Summary of Patient Progress:  Did not attend   Therapeutic Modalities:   Motivational Interviewing Brief Solution-Focused Therapy    Race Latour, LCSW, LCAS Clincal Social Worker   [facility]  

## 2022-07-01 NOTE — Progress Notes (Signed)
   07/01/22 1018  Psych Admission Type (Psych Patients Only)  Admission Status Voluntary  Psychosocial Assessment  Patient Complaints Anxiety  Eye Contact Fair  Facial Expression Animated  Affect Appropriate to circumstance  Speech Logical/coherent  Interaction Assertive  Motor Activity Other (Comment) (WNL)  Appearance/Hygiene Unremarkable  Behavior Characteristics Cooperative;Calm  Mood Depressed  Thought Process  Coherency WDL  Content WDL  Delusions None reported or observed  Perception WDL  Hallucination None reported or observed  Judgment Impaired  Confusion WDL  Danger to Self  Current suicidal ideation? Denies  Danger to Others  Danger to Others None reported or observed

## 2022-07-01 NOTE — Progress Notes (Signed)
Adult Psychoeducational Group Note  Date:  07/01/2022 Time:  8:17 PM  Group Topic/Focus:  Wrap-Up Group:   The focus of this group is to help patients review their daily goal of treatment and discuss progress on daily workbooks.  Participation Level:  Did Not Attend  Participation Quality:  Did Not Attend Affect:  Did Not Attend  Cognitive:  Did Not Attend  Insight: Did Not Attend  Engagement in Group:  Did Not Attend  Modes of Intervention:  Did Not Attend  Additional Comments:   Pt was upset and refused to attend group discussion   Gerhard Perches 07/01/2022, 8:17 PM

## 2022-07-01 NOTE — Progress Notes (Signed)
[facility] MD Progress Note  07/01/2022 10:46 AM Alec Williams  MRN:  [id number] Subjective:    Alec Williams is a 21 year old, African-American male with a past psychiatric history significant for schizoaffective disorder, major depressive disorder, and Williams injurious behavior who was involuntarily admitted to [facility] for psychiatric evaluation due to bizarre behavior and delusional thoughts.   Patient was assessed by Hilaria Ota for reevaluation.  Patient is currently being managed on the following psychiatric medications:  Risperidone ER SUSY 100 mg subcutaneous injection was administered on 06/27/2022 Risperidone (disintegrating tablets) 2 mg 2 times daily Cogentin 0.5 mg 2 times daily Depakote ER 1500 mg daily at bedtime Guanfacine 1 mg at bedtime Trazodone 50 at bedtime as needed Hydroxyzine 25 mg 3 times daily as needed Agitation protocol             -Risperidone 2 mg every 8 hours as needed for agitation AND             -Lorazepam 1 mg every 6 hours as needed for agitation AND             -Geodon 20 mg intramuscular injection every 6 hours as needed for agitation  Patient reports that he had a good past weekend and denies any issues or concerns from this past weekend.  Patient endorses good mood and states that the medications are going well.  Patient adds that they think that the Depakote is too high and states that the medication is giving him dry mouth.  Patient states that he has never taken up to 1500 mg of Depakote before.  Patient informed provider that he has only taken up to 400 mg of Depakote in the past and that he gets sad when taking the amount of Depakote that he is currently taking.  It should be noted, that patient has taken higher doses of Depakote in the past as documented during his past admission at [facility].  Patient's most recent valproic acid level was 35 ug/ml.  Although patient complains of his current Depakote dosage, patient reports that he  did take the medication last night.  Patient denies depression nor does he endorse anxiety at this time.  Patient denies suicidal or homicidal ideations.  He further denies auditory or visual hallucinations.  Patient denies paranoia and delusional thoughts; however, patient continues to state that he is being discharged to his mother even though he has been informed several times that he will be discharging to a shelter.  Patient endorses good sleep and good appetite.  Prior to the conclusion of the assessment, patient provided information to the provider regarding a social worker from adult protective services that he spoke with this past weekend.  Patient informed provider that social worker would like to contact provider to discuss some information regarding the patient.  Provider attempted to contact the number provided by the patient, but was unable to reach the social worker.  Patient continues to exhibit delusional and manipulative behavior.  Patient continues with other scheduled medications without noted EPS, stiffness, bradykinesia, masklike face, cogwheel rigidity, or tremors.  Patient was encouraged to attend group activities and therapeutic milieu.  Due to low valproic acid level, another valproic acid level will be scheduled in the next 3 days.  Principal Problem: Schizoaffective disorder ([place]) Diagnosis: Principal Problem:   Schizoaffective disorder ([place]) Active Problems:   Major depressive disorder, recurrent, severe with psychotic features ([place])  Total Time spent with patient: 15 minutes  Past Psychiatric History:  Patient denies having  a past psychiatric history.   [facility]), patient had a history of Williams-injurious behavior and major depressive disorder   Patient has a past history significant for schizoaffective disorder  Past Medical History:  Past Medical History:  Diagnosis Date   Schizophrenia (HCC)    History reviewed. No  pertinent surgical history. Family History: History reviewed. No pertinent family history. Family Psychiatric  History:  not noted  Social History:  Social History   Substance and Sexual Activity  Alcohol Use No     Social History   Substance and Sexual Activity  Drug Use No    Social History   Socioeconomic History   Marital status: Single    Spouse name: Not on file   Number of children: Not on file   Years of education: Not on file   Highest education level: Not on file  Occupational History   Not on file  Tobacco Use   Smoking status: Former   Smokeless tobacco: Former   Tobacco comments:    "I smoke black and mild"  Vaping Use   Vaping Use: Every day  Substance and Sexual Activity   Alcohol use: No   Drug use: No   Sexual activity: Not on file  Other Topics Concern   Not on file  Social History Narrative   Not on file   Social Determinants of Health   Financial Resource Strain: Not on file  Food Insecurity: No Food Insecurity (06/24/2022)   Hunger Vital Sign    Worried About Running Out of Food in the Last Year: Never true    Ran Out of Food in the Last Year: Never true  Transportation Needs: No Transportation Needs (06/24/2022)   PRAPARE - Administrator, Civil ServiceTransportation    Lack of Transportation (Medical): No    Lack of Transportation (Non-Medical): No  Physical Activity: Not on file  Stress: Not on file  Social Connections: Not on file   Additional Social History:   Sleep: Good  Appetite:  Good  Current Medications: Current Facility-Administered Medications  Medication Dose Route Frequency Provider Last Rate Last Admin   acetaminophen (TYLENOL) tablet 650 mg  650 mg Oral Q6H PRN Cecilie LowersNtuen, Tina C, FNP   650 mg at 07/01/22 0910   alum & mag hydroxide-simeth (MAALOX/MYLANTA) 200-200-20 MG/5ML suspension 30 mL  30 mL Oral Q4H PRN Ntuen, Jesusita Okaina C, FNP       benztropine (COGENTIN) tablet 0.5 mg  0.5 mg Oral BID Zoelle Markus E, PA   0.5 mg at 07/01/22 0813    divalproex (DEPAKOTE ER) 24 hr tablet 2,000 mg  2,000 mg Oral QHS Jatziri Goffredo E, PA       guanFACINE (TENEX) tablet 1 mg  1 mg Oral QHS Massengill, Nathan, MD   1 mg at 06/30/22 2043   hydrOXYzine (ATARAX) tablet 25 mg  25 mg Oral TID PRN Cecilie LowersNtuen, Tina C, FNP   25 mg at 07/01/22 0910   risperiDONE (RISPERDAL M-TABS) disintegrating tablet 2 mg  2 mg Oral Q8H PRN Massengill, Harrold DonathNathan, MD   2 mg at 06/27/22 2048   And   LORazepam (ATIVAN) tablet 1 mg  1 mg Oral Q6H PRN Massengill, Harrold DonathNathan, MD       And   ziprasidone (GEODON) injection 20 mg  20 mg Intramuscular Q6H PRN Massengill, Nathan, MD       magnesium hydroxide (MILK OF MAGNESIA) suspension 30 mL  30 mL Oral Daily PRN Ntuen, Jesusita Okaina C, FNP   30 mL  at 06/30/22 0807   mineral oil-hydrophilic petrolatum (AQUAPHOR) ointment   Topical Q0600 Nicholes Rough, NP   Given at 07/01/22 [id number]   nicotine polacrilex (NICORETTE) gum 2 mg  2 mg Oral PRN Massengill, Ovid Curd, MD   2 mg at 06/30/22 1013   risperiDONE (RISPERDAL M-TABS) disintegrating tablet 2 mg  2 mg Oral QHS Massengill, Nathan, MD   2 mg at 06/30/22 2042   risperiDONE (RISPERDAL M-TABS) disintegrating tablet 2 mg  2 mg Oral Daily Ntuen, Kris Hartmann, FNP   2 mg at 07/01/22 [id number]   risperiDONE ER SUSY 100 mg  100 mg Subcutaneous Q28 days Massengill, Ovid Curd, MD   100 mg at 06/27/22 1301   traZODone (DESYREL) tablet 50 mg  50 mg Oral QHS PRN Laretta Bolster, FNP   50 mg at 06/28/22 2043    Lab Results:  Results for orders placed or performed during the hospital encounter of 06/24/22 (from the past 48 hour(s))  Valproic acid level     Status: Abnormal   Collection Time: 06/30/22  6:34 PM  Result Value Ref Range   Valproic Acid Lvl 35 (L) 50.0 - 100.0 ug/mL    Comment: Performed at [facility], [place] [address], [place], [place] [address]  Hepatic function panel     Status: None   Collection Time: 06/30/22  6:34 PM  Result Value Ref Range   Total Protein 6.8 6.5 - 8.1 g/dL   Albumin 4.4  3.5 - 5.0 g/dL   AST 16 15 - 41 U/L   ALT 32 0 - 44 U/L   Alkaline Phosphatase 71 38 - 126 U/L   Total Bilirubin 0.5 0.3 - 1.2 mg/dL   Bilirubin, Direct 0.1 0.0 - 0.2 mg/dL    Comment: RESULTS VERIFIED BY REPEAT TESTING   Indirect Bilirubin 0.4 0.3 - 0.9 mg/dL    Comment: Performed at [facility], [place] [address] [place], [place] [address]    Blood Alcohol level:  Lab Results  Component Value Date   ETH <10 10/19/2019   ETH <10 0000000    Metabolic Disorder Labs: Lab Results  Component Value Date   HGBA1C 4.7 (L) 06/25/2022   MPG 88.19 06/25/2022   MPG 85.32 07/09/2018   Lab Results  Component Value Date   PROLACTIN 22.9 (H) 07/09/2018   PROLACTIN 10.6 12/10/2017   Lab Results  Component Value Date   CHOL 147 06/25/2022   TRIG 70 06/25/2022   HDL 43 06/25/2022   CHOLHDL 3.4 06/25/2022   VLDL 14 06/25/2022   LDLCALC 90 06/25/2022   LDLCALC 79 07/09/2018    Physical Findings: AIMS: Facial and Oral Movements Muscles of Facial Expression: None, normal Lips and Perioral Area: None, normal Jaw: None, normal Tongue: None, normal,Extremity Movements Upper (arms, wrists, hands, fingers): None, normal Lower (legs, knees, ankles, toes): None, normal, Trunk Movements Neck, shoulders, hips: None, normal, Overall Severity Severity of abnormal movements (highest score from questions above): None, normal Incapacitation due to abnormal movements: None, normal Patient's awareness of abnormal movements (rate only patient's report): No Awareness, Dental Status Current problems with teeth and/or dentures?: No Does patient usually wear dentures?: No  CIWA:    COWS:     Musculoskeletal: Strength & Muscle Tone: within normal limits Gait & Station: normal Patient leans: N/A  Psychiatric Specialty Exam:  Presentation  General Appearance:  Appropriate for Environment; Casual  Eye Contact: Good  Speech: Clear and Coherent; Normal Rate  Speech  Volume: Normal  Handedness: Right   Mood and Affect  Mood: Euthymic  Affect: Congruent   Thought Process  Thought Processes: Coherent  Descriptions of Associations:Intact  Orientation:Full (Time, Place and Person)  Thought Content:Delusions  History of Schizophrenia/Schizoaffective disorder:Yes  Duration of Psychotic Symptoms:Greater than six months  Hallucinations:Hallucinations: None  Ideas of Reference:Delusions  Suicidal Thoughts:Suicidal Thoughts: No  Homicidal Thoughts:Homicidal Thoughts: No   Sensorium  Memory: Immediate Fair; Recent Fair; Remote Fair  Judgment: Fair  Insight: Fair   Community education officer  Concentration: Good  Attention Span: Good  Recall: [place] of Knowledge: Fair  Language: Good   Psychomotor Activity  Psychomotor Activity: Psychomotor Activity: Normal   Assets  Assets: Communication Skills; Physical Health; Social Support   Sleep  Sleep: Sleep: Good Number of Hours of Sleep: 8    Physical Exam: Physical Exam Constitutional:      Appearance: Normal appearance.  HENT:     Head: Normocephalic and atraumatic.     Nose: Nose normal.     Mouth/Throat:     Mouth: Mucous membranes are moist.  Eyes:     Extraocular Movements: Extraocular movements intact.     Pupils: Pupils are equal, round, and reactive to light.  Cardiovascular:     Rate and Rhythm: Normal rate and regular rhythm.  Pulmonary:     Effort: Pulmonary effort is normal.     Breath sounds: Normal breath sounds.  Abdominal:     General: Abdomen is flat.  Musculoskeletal:        General: Normal range of motion.     Cervical back: Normal range of motion and neck supple.  Skin:    General: Skin is warm and dry.  Neurological:     General: No focal deficit present.     Mental Status: He is alert and oriented to person, place, and time.  Psychiatric:        Attention and Perception: Attention and perception normal.        Mood  and Affect: Mood and affect normal. Mood is not anxious or depressed.        Speech: Speech normal.        Behavior: Behavior normal. Behavior is cooperative.        Thought Content: Thought content is delusional. Thought content is not paranoid. Thought content does not include homicidal or suicidal ideation.        Cognition and Memory: Cognition and memory normal.        Judgment: Judgment is inappropriate.    Review of Systems  Constitutional: Negative.   HENT: Negative.    Eyes: Negative.   Respiratory: Negative.    Cardiovascular: Negative.   Gastrointestinal: Negative.   Skin: Negative.   Neurological: Negative.   Psychiatric/Behavioral:  Negative for depression, hallucinations, substance abuse and suicidal ideas. The patient is not nervous/anxious and does not have insomnia.    Blood pressure 123/61, pulse 100, temperature 98 F (36.7 C), temperature source Oral, resp. rate 16, height 5\' 4"  (1.626 m), weight 72.6 kg, SpO2 100 %. Body mass index is 27.46 kg/m.   Treatment Plan Summary: Daily contact with patient to assess and evaluate symptoms and progress in treatment and Medication management  Plan:   Safety and Monitoring: Voluntary admission to inpatient psychiatric unit for safety, stabilization and treatment Daily contact with patient to assess and evaluate symptoms and progress in treatment Patient's case to be discussed in multi-disciplinary team meeting Observation Level : q15 minute checks Vital signs: q12 hours Precautions: safety  Diagnosis: Principal Problem:   Schizoaffective disorder ([place]) Active Problems:   Major depressive disorder, recurrent, severe with psychotic features ([place])   #Major depressive disorder, recurrent, severe with psychotic features #Schizoaffective disorder -Risperidone ER SUSY 100 mg subcutaneous injection was administered on 06/27/2022 -Continue risperidone (disintegrating tablet) 2 mg 2 times daily for mood  stabilization -Continue Cogentin 0.5 mg 2 times daily for management of his extrapyramidal symptoms from antipsychotic use -Adjust Depakote ER dosage to 2000 mg at bedtime for mood stabilization (patient's most recent valproic acid level was 35 ug/ml -Continue guanfacine 1 mg at bedtime for agitation -Agitation protocol             -Risperidone 2 mg every 8 hours as needed for agitation AND             -Lorazepam 1 mg every 6 hours as needed for agitation AND             -Ziprasidone 20 mg intramuscular injection every 6 hours for agitation   Valproic acid level to be collected in the next 3 days.  As needed medications: -Patient to continue taking Tylenol 650 mg every 6 hours as needed for mild pain -Patient to continue taking Maalox/Mylanta 30 mL every 4 hours as needed for indigestion -Patient to continue taking Milk of Magnesia 30 mL as needed for mild constipation -Hydroxyzine 25 mg 3 times daily as needed for anxiety -Trazodone 50 mg at bedtime as needed for insomnia  -Nicotine polacrilex 2 mg as needed for smoking cessation   Discharge Planning: Social work and case management to assist with discharge planning and identification of hospital follow-up needs prior to discharge Estimated LOS: 5-7 days Discharge Concerns: Need to establish a safety plan; Medication compliance and effectiveness Discharge Goals: Return home with outpatient referrals for mental health follow-up including medication management/psychotherapy   I certify that inpatient services furnished can reasonably be expected to improve the patient's condition.    Malachy Mood, PA 07/01/2022, 10:46 AM

## 2022-07-01 NOTE — Progress Notes (Signed)
Pt is A&OX4, angry his Depakote medication was increased, denies suicidal ideations, denies homicidal ideations, denies auditory hallucinations and denies visual hallucinations. Pt verbally agrees to approach staff if these become apparent and before harming self or others. Pt denies experiencing nightmares. Mood and affect are congruent. Pt appetite is ok. No complaints of anxiety, distress, pain and/or discomfort at this time. Pt's memory appears to be grossly intact, and Pt hasn't displayed any injurious behaviors. Pt is medication compliant. There's no evidence of suicidal intent. Psychomotor activity was WNL. No s/s of Parkinson, Dystonia, Akathisia and/or Tardive Dyskinesia noted.

## 2022-07-01 NOTE — [facility] Group Notes (Signed)
Adult Psychoeducational Group Note  Date:  07/01/2022 Time:  9:30 AM  Group Topic/Focus:  Goals Group:   The focus of this group is to help patients establish daily goals to achieve during treatment and discuss how the patient can incorporate goal setting into their daily lives to aide in recovery.  Participation Level:  Active  Participation Quality:  Appropriate  Affect:  Appropriate  Cognitive:  Appropriate  Insight: Appropriate  Engagement in Group:  Engaged  Modes of Intervention:  Discussion   Dub Mikes 07/01/2022, 9:30 AM

## 2022-07-01 NOTE — Plan of Care (Signed)
  Problem: Education: Goal: Knowledge of the prescribed therapeutic regimen will improve Outcome: Progressing   Problem: Coping: Goal: Coping ability will improve Outcome: Progressing   Problem: Nutritional: Goal: Ability to achieve adequate nutritional intake will improve Outcome: Progressing   Problem: Role Relationship: Goal: Ability to communicate needs accurately will improve Outcome: Progressing   Problem: Role Relationship: Goal: Ability to interact with others will improve Outcome: Progressing

## 2022-07-02 MED ORDER — AQUAPHOR EX OINT: TOPICAL_OINTMENT | Freq: Every day | CUTANEOUS | 0 refills | Status: DC

## 2022-07-02 MED ORDER — RISPERIDONE 1 MG PO TABS: 1.0000 mg | ORAL_TABLET | ORAL | Status: DC

## 2022-07-02 MED ORDER — NICOTINE POLACRILEX 2 MG MT GUM: 2.0000 mg | CHEWING_GUM | OROMUCOSAL | 0 refills | Status: DC | PRN

## 2022-07-02 MED ORDER — RISPERIDONE 2 MG PO TABS
2.0000 mg | ORAL_TABLET | Freq: Two times a day (BID) | ORAL | Status: DC
  Filled 2022-07-02: qty 1

## 2022-07-02 MED ORDER — BENZTROPINE MESYLATE 0.5 MG PO TABS: 0.5000 mg | ORAL_TABLET | Freq: Two times a day (BID) | ORAL | 0 refills | Status: DC

## 2022-07-02 MED ORDER — TRAZODONE HCL 50 MG PO TABS: 50.0000 mg | ORAL_TABLET | Freq: Every evening | ORAL | 0 refills | Status: DC | PRN

## 2022-07-02 MED ORDER — RISPERIDONE 1 MG PO TABS: 1.0000 mg | ORAL_TABLET | Freq: Two times a day (BID) | ORAL | Status: DC

## 2022-07-02 MED ORDER — DIVALPROEX SODIUM ER 500 MG PO TB24
1500.0000 mg | ORAL_TABLET | Freq: Every day | ORAL | Status: DC
  Filled 2022-07-02: qty 3

## 2022-07-02 MED ORDER — DIVALPROEX SODIUM ER 500 MG PO TB24: 1500.0000 mg | ORAL_TABLET | Freq: Every day | ORAL | 0 refills | Status: DC

## 2022-07-02 MED ORDER — RISPERIDONE 0.5 MG PO TABS: 0.5000 mg | ORAL_TABLET | Freq: Every day | ORAL | Status: DC

## 2022-07-02 MED ORDER — UZEDY 100 MG/0.28ML ~~LOC~~ SUSY: 100.0000 mg | PREFILLED_SYRINGE | Freq: Once | SUBCUTANEOUS | 0 refills | Status: DC

## 2022-07-02 MED ORDER — RISPERIDONE 1 MG PO TABS: 1.0000 mg | ORAL_TABLET | ORAL | 0 refills | Status: DC

## 2022-07-02 MED ORDER — HYDROXYZINE HCL 25 MG PO TABS: 25.0000 mg | ORAL_TABLET | Freq: Three times a day (TID) | ORAL | 0 refills | Status: AC | PRN

## 2022-07-02 MED ORDER — MEDERMA EX GEL: 1.0000 | Freq: Every day | CUTANEOUS | 0 refills | Status: DC

## 2022-07-02 MED ORDER — RISPERIDONE 1 MG PO TABS: 1.0000 mg | ORAL_TABLET | Freq: Every day | ORAL | Status: DC

## 2022-07-02 MED ORDER — GUANFACINE HCL 1 MG PO TABS: 1.0000 mg | ORAL_TABLET | Freq: Every day | ORAL | 0 refills | Status: DC

## 2022-07-02 NOTE — Discharge Instructions (Signed)
-  Follow-up with your outpatient psychiatric provider -instructions on appointment date, time, and address (location) are provided to you in discharge paperwork.  -Take your psychiatric medications as prescribed at discharge - instructions are provided to you in the discharge paperwork.  **You will need to follow-up with outpatient psychiatrist and have routine monitoring of your blood work and depakote level within 1 month**  -Follow-up with outpatient primary care doctor and other specialists -for management of preventative medicine and any chronic medical disease.  -Recommend abstinence from alcohol, tobacco, and other illicit drug use at discharge.   -If your psychiatric symptoms recur, worsen, or if you have side effects to your psychiatric medications, call your outpatient psychiatric provider, 911, [phone] or go to the nearest emergency department.  -If suicidal thoughts recur, call your outpatient psychiatric provider, 911, [phone] or go to the nearest emergency department.

## 2022-07-02 NOTE — Care Plan (Signed)
I was notified today about 12pm that the patient had signed a 72 hour form requesting for discharge sometime last week (date/time unsure). This came to light when the social worker was discussing discharge with the patient today, and he indicated that he signed the 72 hour form previously, and one of the nurses on the unit said that he had signed it last week. I checked the shadow (paper chart) and no 72 hour form was in this chart (And I verified with Katrina Stack if pt had signed this form, if there was any IVC on this patient). I discussed with Education officer, museum, physician assistant also seeing this patient, and the nurse on the unit. Nurse reported that the pt had signed the form last week. I next discussed with the patient what had occurred - that apparently he had signed the 72 hour form, it was not placed into his chart, nothing was placed into epic to signify it had been signed, and no communication to provider team occurred to my knowledge. I discussed with the patient, that we will discharge him immediately upon this finding. I asked if pt had questions about this, and he did not. He requested paper prescriptions which I printed. I communicated with the PA to complete SRA and dc summary, and I placed the dc med rec and dc orders. SW communicated with [place] to verify pt had no legal guardian (this issue came up around the same time but was not related to the 72 hour form issue), and then SW communicated with mother, who accepted the patient to return home today.   Janine Limbo, MD Psychiatrist

## 2022-07-02 NOTE — B[facility] Counselor (Signed)
[facility] LCSW Progress Note   07/02/2022    11:13 AM  [name]   [id number]   Type of Contact and Topic:  Discharge planning and consent to speak with collateral  CSW met with patient and discussed discharge planning. Patient states that his brother will allow him to stay with him.  CSW received permission to speak to brother and called brother.  Brother states that he wants his brother home but that him and his mother have conflict.  Brother states that he also lives with mother. CSW discussed if patient can return home.  Brother would like patient to come home, however mom has been saying that patient cannot return home. CSW notified nursing and provider. CSW will continue to follow up regarding patient wishes for discharge.     Signed:  Riki Altes MSW, LCSW, LCAS 07/02/2022 11:13 AM

## 2022-07-02 NOTE — Progress Notes (Signed)
Patient ID: Alec Williams, male   DOB: 29-Mar-2001, 21 y.o.   MRN: [id number]   Pt ambulatory, alert, and oriented X4 on and off the unit. Education, support, and encouragement provided. Discharge summary/AVS, prescriptions, medications, and follow up appointments reviewed with pt and a copy of the AVS was given to pt. Medications "next dose" was also reviewed with pt and marked/notated on pt's med list on AVS. Suicide prevention resources provided. Pt's belongings in locker #50 returned and belongings sheet signed. Pt denies SI/HI, AVH, pain, or any concerns at this time. Pt discharged to lobby to [facility].

## 2022-07-02 NOTE — Progress Notes (Signed)
  [facility] Adult Case Management Discharge Plan :  Will you be returning to the same living situation after discharge:  Yes,  back to mothers At discharge, do you have transportation home?: Yes,  patient will utilize taxi transportation Do you have the ability to pay for your medications: Yes,  medicaid  Release of information consent forms completed and in the chart;  Patient's signature needed at discharge.  Patient to Follow up at:  Follow-up Information     [facility] Follow up.   Why: We have called to assist with setting up an appointment but have been unable to get a hold of this provider.  Please call at your earliest convenience for follow up with med managment and therapy. Contact information: [place]  [place] [address] [phone]         [facility] Call.   Why: You have been referred to this provider for ACTT services.  Please call to get set up with ongoing wrap around services for mental health follow up. Contact information: [address] [place] [address] [facility] Follow up.   Specialty: Urgent Care Why: You can go to this provider on Monday or Wednesday at 7:30am for follow up with med management and therapy.  It is first come first serve. Contact information: [place] [phone]                Next level of care provider has access to [facility] and Suicide Prevention discussed: Yes,  mother and brother     Has patient been referred to the Quitline?: N/A patient is not a smoker  Patient has been referred for addiction treatment: [place], LCSW 07/02/2022, 2:00 PM

## 2022-07-02 NOTE — Discharge Summary (Signed)
Physician Discharge Summary Note  Patient:  Alec Williams is an 21 y.o., male MRN:  [id number] DOB:  11/28/2000 Patient phone:  [phone] (home)  Patient address:   [address] [place] [address],  Total Time spent with patient: 15 minutes  Date of Admission:  06/24/2022 Date of Discharge: 07/02/2022  Reason for Admission:    Alec Williams is a 21 year old, African-American male with a past psychiatric history significant for major depressive disorder (recurrent severe, with psychotic features) and self-injurious behavior who voluntarily presented to [facility] for evaluation due to reported 6 months of pregnancy and vomiting.  It was previously seen at this facility on April 2019 and October 2019.  During the assessment, patient is extremely delusional and was constantly being redirected.  Patient's history may be unreliable due to patient's mental state.   Patient reports that he is unsure of why he was admitted to [facility] but states that he was having flulike symptoms prior to his admission to [facility].  Patient states that he believes that he is here to have his flu shot and as soon as he gets his flu shot, he will be discharged.  He reports that he is unsure of what his mother put on his forms before being admitted to this facility.  Patient denies experiencing any bizarre behaviors but states that he was having bad thoughts and was texting things to his cousin prior to admission.   Patient reports that he was experiencing anxiousness due to believing that he cannot get flu shots.  He also reports anxiety due to believing that his mom would not accept him back home.  He reports that he feels that his family does not love him.  Patient also added that he experiences anxiety because of being on a lot of pills.  He states that he hopes this is the last time he is here at this hospital and hopes to be discharged today.  Patient endorses excessive  worrying and social anxiety.  He denies panic attack but states that he had a panic attack when in jail.     Patient denies experiencing depression but then reported that he was having depression due to being in prison and having issues with his family.  Patient patient endorses the following depressive symptoms: Insomnia, hypersomnia, lack of interest in activities or hobbies, depressed mood, feelings of guilt/worthlessness, decreased energy, decreased concentration, self-isolation, decreased appetite, irritability, memory issues, crease libido.  He denied suicidal ideations.  Patient added that he has been barely able to sleep since being discharged from jail but while he was in jail, patient states that he slept for 23 hours a day.  Patient reports that he was recently released from prison after serving 25 months.  He then explained that he was released from prison after serving 17 months for stalking someone.  Patient also endorses distractibility, elevated mood, grandiosity, racing thoughts, irritability, mood swings, pressured speech, and sleep deficit.   Patient reported being in a good mood by giving a thumbs up.  He denies current depression and states that he is unsure of anxiety at this time.  He denied suicidal or homicidal ideations but then later stated that he was not sure of homicidal ideations.  He denied auditory or visual hallucinations but states that prior to his admission to the facility, he was hearing demons and seeing the devil and zombies.  Patient endorses paranoia stating that he is unsure if he will be able to  return back home stating that his mother said he needed to go to a shelter.  Patient denies traumatic experiences but states that he has flashbacks of himself doing witchcraft with his mother.  He reports that his mother has since stopped witchcraft.  Patient reports that he has nightmares that consists gobs, demons, and witchcraft. During the assessment, patient is extremely  delusional and was constantly being redirected.  Patient's history may be unreliable due to patient's current mental state.  Principal Problem: Schizoaffective disorder [facility]) Discharge Diagnoses: Principal Problem:   Schizoaffective disorder (HCC) Active Problems:   Major depressive disorder, recurrent, severe with psychotic features [facility])   Past Psychiatric History:  Patient denies having a past psychiatric history.   Per patient's previous [facility] admission (07/07/2018), patient had a history of self-injurious behavior and major depressive disorder   Patient has a past history significant for schizoaffective disorder  Past Medical History:  Past Medical History:  Diagnosis Date   Schizophrenia (HCC)    History reviewed. No pertinent surgical history. Family History: History reviewed. No pertinent family history. Family Psychiatric  History:  Not noted  Social History:  Social History   Substance and Sexual Activity  Alcohol Use No     Social History   Substance and Sexual Activity  Drug Use No    Social History   Socioeconomic History   Marital status: Single    Spouse name: Not on file   Number of children: Not on file   Years of education: Not on file   Highest education level: Not on file  Occupational History   Not on file  Tobacco Use   Smoking status: Former   Smokeless tobacco: Former   Tobacco comments:    "I smoke black and mild"  Vaping Use   Vaping Use: Every day  Substance and Sexual Activity   Alcohol use: No   Drug use: No   Sexual activity: Not on file  Other Topics Concern   Not on file  Social History Narrative   Not on file   Social Determinants of Health   Financial Resource Strain: Not on file  Food Insecurity: No Food Insecurity (06/24/2022)   Hunger Vital Sign    Worried About Running Out of Food in the Last Year: Never true    Ran Out of Food in the Last Year: Never true  Transportation Needs: No Transportation Needs  (06/24/2022)   PRAPARE - Administrator, Civil Service (Medical): No    Lack of Transportation (Non-Medical): No  Physical Activity: Not on file  Stress: Not on file  Social Connections: Not on file    Hospital Course:    During the patient's hospitalization, patient had extensive initial psychiatric evaluation, and follow-up psychiatric evaluations every day.   Psychiatric diagnoses provided upon initial assessment: Schizoaffective disorder, major depressive disorder, recurrent, severe with psychotic features   Patient's psychiatric medications were adjusted on admission:  -Start Risperdal 1 mg 2 times daily -Cogentin 0.5 mg 2 times daily   -Agitation protocol             -Risperidone 2 mg every 8 hours as needed for agitation AND             -Lorazepam 1 mg every 6 hours as needed for agitation AND             -Prazosin 20 mg intramuscular injection every 6 hours for agitation   During the hospitalization, other adjustments were made to the patient's  psychiatric medication regimen:  -Increase Risperidone to 2 mg 2 times daily -Continue Cogentin 0.5 mg 2 times daily -Risperidone ER SUSY 100 mg subcutaneous injection was administered on 06/27/2022 -Increase Depakote ER to 2000 mg daily at bedtime -Guanfacine 1 mg at bedtime -Trazodone 50 mg at bedtime as needed -Hydroxyzine 25 mg 3 times daily as needed   -Agitation protocol             -Risperidone 2 mg every 8 hours as needed for agitation AND             -Lorazepam 1 mg every 6 hours as needed for agitation AND             -Prazosin 20 mg intramuscular injection every 6 hours for agitation   Patient's care was discussed during the interdisciplinary team meeting every day during the hospitalization.   The patient denies having side effects to prescribed psychiatric medication.   Gradually, patient started adjusting to milieu. The patient was evaluated each day by a clinical provider to ascertain response to  treatment. Improvement was noted by the patient's report of decreasing symptoms, improved sleep and appetite, affect, medication tolerance, behavior, and participation in unit programming.  Patient was asked each day to complete a self inventory noting mood, mental status, pain, new symptoms, anxiety and concerns.     Symptoms were reported as significantly decreased or resolved completely by discharge.    On day of discharge, the patient reports that their mood is stable. The patient denied having suicidal thoughts for more than 48 hours prior to discharge.  Patient denies having homicidal thoughts.  Patient denies having auditory hallucinations.  Patient denies any visual hallucinations or other symptoms of psychosis. The patient was motivated to continue taking medication with a goal of continued improvement in mental health.    The patient reports their target psychiatric symptoms of schizoaffective disorder and major depressive disorder, recurrent, severe with psychotic features responded well to the psychiatric medications, and the patient reports overall benefit other psychiatric hospitalization. Supportive psychotherapy was provided to the patient. The patient also participated in regular group therapy while hospitalized. Coping skills, problem solving as well as relaxation therapies were also part of the unit programming.   Labs were reviewed with the patient, and abnormal results were discussed with the patient.   The patient is able to verbalize their individual safety plan to this provider.   # It is recommended to the patient to continue psychiatric medications as prescribed, after discharge from the hospital.     # It is recommended to the patient to follow up with your outpatient psychiatric provider and PCP.   # It was discussed with the patient, the impact of alcohol, drugs, tobacco have been there overall psychiatric and medical wellbeing, and total abstinence from substance use was  recommended the patient.ed.   # Prescriptions provided or sent directly to preferred pharmacy at discharge. Patient agreeable to plan. Given opportunity to ask questions. Appears to feel comfortable with discharge.    # In the event of worsening symptoms, the patient is instructed to call the crisis hotline, 911 and or go to the nearest ED for appropriate evaluation and treatment of symptoms. To follow-up with primary care provider for other medical issues, concerns and or health care needs   # Patient was discharged home with a plan to follow up as noted below.  Physical Findings: AIMS: Facial and Oral Movements Muscles of Facial Expression: None, normal Lips and Perioral Area: None,  normal Jaw: None, normal Tongue: None, normal,Extremity Movements Upper (arms, wrists, hands, fingers): None, normal Lower (legs, knees, ankles, toes): None, normal, Trunk Movements Neck, shoulders, hips: None, normal, Overall Severity Severity of abnormal movements (highest score from questions above): None, normal Incapacitation due to abnormal movements: None, normal Patient's awareness of abnormal movements (rate only patient's report): No Awareness, Dental Status Current problems with teeth and/or dentures?: No Does patient usually wear dentures?: No  CIWA:    COWS:     Musculoskeletal: Strength & Muscle Tone: within normal limits Gait & Station: normal Patient leans: N/A  Aims score zero on day of dc. No eps on day of dc  Psychiatric Specialty Exam:  Presentation  General Appearance:  Appropriate for Environment; Casual  Eye Contact: Good  Speech: Clear and Coherent; Normal Rate  Speech Volume: Normal  Handedness: Right   Mood and Affect  Mood: Euthymic  Affect: Congruent   Thought Process  Thought Processes: Coherent  Descriptions of Associations:Intact  Orientation:Full (Time, Place and Person)  Thought Content:Delusions  History of  Schizophrenia/Schizoaffective disorder:Yes  Duration of Psychotic Symptoms:Greater than six months  Hallucinations:Hallucinations: None  Ideas of Reference:Delusions  Suicidal Thoughts:Suicidal Thoughts: No  Homicidal Thoughts:Homicidal Thoughts: No   Sensorium  Memory: Immediate Fair; Recent Fair; Remote Fair  Judgment: Fair  Insight: Fair   Art therapist  Concentration: Good  Attention Span: Good  Recall: Fair  Fund of Knowledge: Fair  Language: Good   Psychomotor Activity  Psychomotor Activity: Psychomotor Activity: Normal   Assets  Assets: Communication Skills; Physical Health; Social Support   Sleep  Sleep: Sleep: Good    Physical Exam: Physical Exam Constitutional:      Appearance: Normal appearance.  HENT:     Head: Normocephalic and atraumatic.     Nose: Nose normal.     Mouth/Throat:     Mouth: Mucous membranes are moist.  Eyes:     Extraocular Movements: Extraocular movements intact.     Pupils: Pupils are equal, round, and reactive to light.  Cardiovascular:     Rate and Rhythm: Normal rate and regular rhythm.  Pulmonary:     Effort: Pulmonary effort is normal.     Breath sounds: Normal breath sounds.  Abdominal:     General: Abdomen is flat.  Musculoskeletal:        General: Normal range of motion.     Cervical back: Normal range of motion and neck supple.  Skin:    General: Skin is warm and dry.  Neurological:     General: No focal deficit present.     Mental Status: He is alert and oriented to person, place, and time.  Psychiatric:        Attention and Perception: Attention and perception normal. He does not perceive auditory or visual hallucinations.        Mood and Affect: Mood and affect normal. Mood is not anxious or depressed.        Speech: Speech normal.        Behavior: Behavior normal. Behavior is cooperative.        Thought Content: Thought content is delusional. Thought content is not paranoid.  Thought content does not include homicidal or suicidal ideation.        Cognition and Memory: Cognition and memory normal.        Judgment: Judgment normal.    Review of Systems  Constitutional: Negative.   HENT: Negative.    Eyes: Negative.   Respiratory: Negative.  Cardiovascular: Negative.   Gastrointestinal: Negative.   Skin: Negative.   Neurological: Negative.   Psychiatric/Behavioral:  Negative for depression, hallucinations, substance abuse and suicidal ideas. The patient is not nervous/anxious and does not have insomnia.    Blood pressure 117/76, pulse (!) 121, temperature 98 F (36.7 C), temperature source Oral, resp. rate 16, height  (1.626 m), weight 72.6 kg, SpO2 100 %. Body mass index is 27.46 kg/m.   Social History   Tobacco Use  Smoking Status Former  Smokeless Tobacco Former  Tobacco Comments   "I smoke black and mild"   Tobacco Cessation:  A prescription for an FDA-approved tobacco cessation medication provided at discharge   Blood Alcohol level:  Lab Results  Component Value Date   ETH <10 10/19/2019   ETH <10 07/07/2018    Metabolic Disorder Labs:  Lab Results  Component Value Date   HGBA1C 4.7 (L) 06/25/2022   MPG 88.19 06/25/2022   MPG 85.32 07/09/2018   Lab Results  Component Value Date   PROLACTIN 22.9 (H) 07/09/2018   PROLACTIN 10.6 12/10/2017   Lab Results  Component Value Date   CHOL 147 06/25/2022   TRIG 70 06/25/2022   HDL 43 06/25/2022   CHOLHDL 3.4 06/25/2022   VLDL 14 06/25/2022   LDLCALC 90 06/25/2022   LDLCALC 79 07/09/2018    See Psychiatric Specialty Exam and Suicide Risk Assessment completed by Attending Physician prior to discharge.  Discharge destination:  Other:  Home  Is patient on multiple antipsychotic therapies at discharge:  No   Has Patient had three or more failed trials of antipsychotic monotherapy by history:  No  Recommended Plan for Multiple Antipsychotic Therapies: NA  Discharge  Instructions     Diet - low sodium heart healthy   Complete by: As directed    Increase activity slowly   Complete by: As directed       Allergies as of 07/02/2022   No Known Allergies      Medication List     TAKE these medications      Indication  benztropine 0.5 MG tablet Commonly known as: COGENTIN Take 1 tablet (0.5 mg total) by mouth 2 (two) times daily. What changed: additional instructions  Indication: Extrapyramidal Reaction caused by Medications   divalproex 500 MG 24 hr tablet Commonly known as: DEPAKOTE ER Take 3 tablets (1,500 mg total) by mouth at bedtime.  Indication: MIXED BIPOLAR AFFECTIVE DISORDER   guanFACINE 1 MG tablet Commonly known as: TENEX Take 1 tablet (1 mg total) by mouth at bedtime.  Indication: adhd   hydrOXYzine 25 MG tablet Commonly known as: ATARAX Take 1 tablet (25 mg total) by mouth 3 (three) times daily as needed for anxiety. What changed:  medication strength how much to take when to take this  Indication: Feeling Anxious   Mederma Gel Apply 1 Application topically daily. Start taking on: July 03, 2022  Indication: scar   mineral oil-hydrophilic petrolatum ointment Apply topically daily at 6 (six) AM. Start taking on: July 03, 2022  Indication: scar   nicotine polacrilex 2 MG gum Commonly known as: NICORETTE Take 1 each (2 mg total) by mouth as needed for smoking cessation.  Indication: Nicotine Addiction   risperiDONE 1 MG tablet Commonly known as: RISPERDAL Take 1 tablet (1 mg total) by mouth as directed for 23 doses. For days 1-3, take 2 tablets in the morning and 2 tablets in the evening. For days 4-6, take 1 tablet in the morning and  1 tablet in the evening.  For days 7-9, take 1 tablet in the evening. For days 10-14, take 0.5 tablet in the evening. Then stop. Start taking on: July 03, 2022 What changed:  medication strength how much to take when to take this additional instructions   Indication: Schizophrenia   Uzedy 100 MG/0.28ML Susy Generic drug: risperiDONE ER Inject 100 mg into the skin once for 1 dose. Next administration is due on 07-26-2022. Start taking on: July 26, 2022 What changed: You were already taking a medication with the same name, and this prescription was added. Make sure you understand how and when to take each.  Indication: Schizophrenia   traZODone 50 MG tablet Commonly known as: DESYREL Take 1 tablet (50 mg total) by mouth at bedtime as needed for sleep.  Indication: Trouble Sleeping        Follow-up Information     [facility] PART III Follow up.                  Follow-up recommendations:    Activity: As tolerated  Diet: Heart healthy  Other: -Follow-up with your outpatient psychiatric provider -instructions on appointment date, time, and address (location) are provided to you in discharge paperwork.   -Take your psychiatric medications as prescribed at discharge - instructions are provided to you in the discharge paperwork.    -Follow-up with outpatient primary care doctor and other specialists -for management of preventative medicine and chronic medical disease, including: n/a   -Testing: Follow-up with outpatient provider for abnormal lab results:  none   -Recommend abstinence from alcohol, tobacco, and other illicit drug use at discharge.    -If your psychiatric symptoms recur, worsen, or if you have side effects to your psychiatric medications, call your outpatient psychiatric provider, 911, [phone] or go to the nearest emergency department.   -If suicidal thoughts recur, call your outpatient psychiatric provider, 911, [phone] or go to the nearest emergency department.  Signed: Meta HatchetUchenna E Brissa Asante, PA 07/02/2022, 1:33 PM

## 2022-07-02 NOTE — Group Note (Signed)
Recreation Therapy Group Note   Group Topic:Coping Skills  Group Date: 07/02/2022 Start Time: 1000 End Time: 1050 Facilitators: Deshawn Witty-McCall, LRT,CTRS Location: [address] Dayroom   Goal Area(s) Addresses:  Patient will identify positive coping skills. Patient will identify benefits of using positive coping skills post d/c.  Group Description:  Mind Map.  Patient was provided a blank template of a diagram with 32 blank boxes in a tiered system, branching from the center (similar to a bubble chart). LRT directed patients to label the middle of the diagram "Coping Skills".  LRT and patients came up with 8 instances (anger, depression, anxiety, frustration, nervousness, grief, family and progression) where positive coping skills could be used and recorded those in the 2nd tier boxes closest to the center. Patients were to then come up with 3 positive coping skills to use for each instance identified.  LRT would then write the responses on the board so patients could fill in any blank spaces they may have had on their sheets.    Affect/Mood: Appropriate   Participation Level: Active   Participation Quality: Independent   Behavior: Appropriate   Speech/Thought Process: Relevant   Insight: Moderate   Judgement: Moderate   Modes of Intervention: Worksheet   Patient Response to Interventions:  Attentive   Education Outcome:  Acknowledges education and In group clarification offered    Clinical Observations/Individualized Feedback: Pt was appropriate during group session.  Pt was mostly attentive but did make some contributions to the topic.  Pt identified instruments, drinking water, walking, pets and family dinners as coping skills.  Pt was able to stay on task during group session.     Plan: Continue to engage patient in RT group sessions 2-3x/week.   Alexandra Posadas-McCall, LRT,CTRS 07/02/2022 11:28 AM

## 2022-07-02 NOTE — [facility] Suicide Risk Assessment (Signed)
Suicide Risk Assessment  Discharge Assessment    [facility] Discharge Suicide Risk Assessment   Principal Problem: Schizoaffective disorder [facility]) Discharge Diagnoses: Principal Problem:   Schizoaffective disorder (HCC) Active Problems:   Major depressive disorder, recurrent, severe with psychotic features (HCC)   HPI:  Alec Williams is a 21 year old, African-American male with a past psychiatric history significant for schizoaffective disorder, major depressive disorder, and self injurious behavior who was involuntarily admitted to [facility] for psychiatric evaluation due to bizarre behavior and delusional thoughts.   HOSPITAL COURSE:  During the patient's hospitalization, patient had extensive initial psychiatric evaluation, and follow-up psychiatric evaluations every day.  Psychiatric diagnoses provided upon initial assessment: Schizoaffective disorder, major depressive disorder, recurrent, severe with psychotic features  Patient's psychiatric medications were adjusted on admission:  -Start Risperdal 1 mg 2 times daily -Cogentin 0.5 mg 2 times daily  -Agitation protocol             -Risperidone 2 mg every 8 hours as needed for agitation AND             -Lorazepam 1 mg every 6 hours as needed for agitation AND             -Prazosin 20 mg intramuscular injection every 6 hours for agitation  During the hospitalization, other adjustments were made to the patient's psychiatric medication regimen:  -Increase Risperidone to 2 mg 2 times daily -Continue Cogentin 0.5 mg 2 times daily -Risperidone ER SUSY 100 mg subcutaneous injection was administered on 06/27/2022 -Increase Depakote ER to 2000 mg daily at bedtime -Guanfacine 1 mg at bedtime -Trazodone 50 mg at bedtime as needed -Hydroxyzine 25 mg 3 times daily as needed  -Agitation protocol             -Risperidone 2 mg every 8 hours as needed for agitation AND             -Lorazepam 1 mg every 6 hours as needed for agitation AND              -Prazosin 20 mg intramuscular injection every 6 hours for agitation  Patient's care was discussed during the interdisciplinary team meeting every day during the hospitalization.  The patient denies having side effects to prescribed psychiatric medication.  Gradually, patient started adjusting to milieu. The patient was evaluated each day by a clinical provider to ascertain response to treatment. Improvement was noted by the patient's report of decreasing symptoms, improved sleep and appetite, affect, medication tolerance, behavior, and participation in unit programming.  Patient was asked each day to complete a self inventory noting mood, mental status, pain, new symptoms, anxiety and concerns.    Symptoms were reported as significantly decreased or resolved completely by discharge.   On day of discharge, the patient reports that their mood is stable. The patient denied having suicidal thoughts for more than 48 hours prior to discharge.  Patient denies having homicidal thoughts.  Patient denies having auditory hallucinations.  Patient denies any visual hallucinations or other symptoms of psychosis. The patient was motivated to continue taking medication with a goal of continued improvement in mental health.   The patient reports their target psychiatric symptoms of schizoaffective disorder and major depressive disorder, recurrent, severe with psychotic features responded well to the psychiatric medications, and the patient reports overall benefit other psychiatric hospitalization. Supportive psychotherapy was provided to the patient. The patient also participated in regular group therapy while hospitalized. Coping skills, problem solving as well as relaxation therapies were also part of the  unit programming.  Labs were reviewed with the patient, and abnormal results were discussed with the patient.  The patient is able to verbalize their individual safety plan to this provider.  # It is  recommended to the patient to continue psychiatric medications as prescribed, after discharge from the hospital.    # It is recommended to the patient to follow up with your outpatient psychiatric provider and PCP.  # It was discussed with the patient, the impact of alcohol, drugs, tobacco have been there overall psychiatric and medical wellbeing, and total abstinence from substance use was recommended the patient.ed.  # Prescriptions provided or sent directly to preferred pharmacy at discharge. Patient agreeable to plan. Given opportunity to ask questions. Appears to feel comfortable with discharge.    # In the event of worsening symptoms, the patient is instructed to call the crisis hotline, 911 and or go to the nearest ED for appropriate evaluation and treatment of symptoms. To follow-up with primary care provider for other medical issues, concerns and or health care needs  # Patient was discharged [facility] with a plan to follow up as noted below.    Total Time spent with patient: 15 minutes  Musculoskeletal: Strength & Muscle Tone: within normal limits Gait & Station: normal Patient leans: N/A  Aims score zero on day of dc. No eps on day of dc  Psychiatric Specialty Exam  Presentation  General Appearance:  Appropriate for Environment; Casual  Eye Contact: Good  Speech: Clear and Coherent; Normal Rate  Speech Volume: Normal  Handedness: Right   Mood and Affect  Mood: Euthymic  Duration of Depression Symptoms: No data recorded Affect: Congruent   Thought Process  Thought Processes: Coherent  Descriptions of Associations:Intact  Orientation:Full (Time, Place and Person)  Thought Content:Delusions  History of Schizophrenia/Schizoaffective disorder:Yes  Duration of Psychotic Symptoms:Greater than six months  Hallucinations:Hallucinations: None  Ideas of Reference:Delusions  Suicidal Thoughts:Suicidal Thoughts: No  Homicidal Thoughts:Homicidal Thoughts:  No   Sensorium  Memory: Immediate Fair; Recent Fair; Remote Fair  Judgment: Fair  Insight: Fair   Community education officer  Concentration: Good  Attention Span: Good  Recall: Fair  Fund of Knowledge: Fair  Language: Good   Psychomotor Activity  Psychomotor Activity: Psychomotor Activity: Normal   Assets  Assets: Communication Skills; Physical Health; Social Support   Sleep  Sleep: Sleep: Good   Physical Exam: Physical Exam Constitutional:      Appearance: Normal appearance.  HENT:     Head: Normocephalic and atraumatic.     Nose: Nose normal.     Mouth/Throat:     Mouth: Mucous membranes are moist.  Eyes:     Extraocular Movements: Extraocular movements intact.     Pupils: Pupils are equal, round, and reactive to light.  Cardiovascular:     Rate and Rhythm: Regular rhythm.  Pulmonary:     Effort: Pulmonary effort is normal.     Breath sounds: Normal breath sounds.  Abdominal:     General: Abdomen is flat.  Musculoskeletal:        General: Normal range of motion.     Cervical back: Normal range of motion and neck supple.  Skin:    General: Skin is warm and dry.  Neurological:     General: No focal deficit present.     Mental Status: He is alert and oriented to person, place, and time.  Psychiatric:        Attention and Perception: Attention and perception normal. He does not perceive  auditory or visual hallucinations.        Mood and Affect: Mood and affect normal. Mood is not anxious or depressed.        Speech: Speech normal.        Behavior: Behavior normal. Behavior is cooperative.        Thought Content: Thought content is delusional. Thought content is not paranoid. Thought content does not include homicidal or suicidal ideation.        Cognition and Memory: Cognition and memory normal.        Judgment: Judgment normal.    Review of Systems  Constitutional: Negative.   HENT: Negative.    Eyes: Negative.   Respiratory: Negative.     Cardiovascular: Negative.   Gastrointestinal: Negative.   Skin: Negative.   Neurological: Negative.   Psychiatric/Behavioral:  Negative for depression, hallucinations, substance abuse and suicidal ideas. The patient is not nervous/anxious and does not have insomnia.    Blood pressure 117/76, pulse (!) 121, temperature 98 F (36.7 C), temperature source Oral, resp. rate 16, height 5\' 4"  (1.626 m), weight 72.6 kg, SpO2 100 %. Body mass index is 27.46 kg/m.  Mental Status Per Nursing Assessment::   On Admission:  NA  Nursing information obtained from:  Patient Demographic factors:  Male, Adolescent or young adult Loss Factors:  NA Historical Factors:  Impulsivity Risk Reduction Factors:  Positive social support  Suicide Risk:  Mild:  Suicidal ideation of limited frequency, intensity, duration, and specificity.  There are no identifiable plans, no associated intent, mild dysphoria and related symptoms, good self-control (both objective and subjective assessment), few other risk factors, and identifiable protective factors, including available and accessible social support.   Follow-up Information     [facility] COMP PART III Follow up.                  Plan Of Care/Follow-up recommendations:   Activity: As tolerated  Diet: Heart healthy  Other: -Follow-up with your outpatient psychiatric provider -instructions on appointment date, time, and address (location) are provided to you in discharge paperwork.   -Take your psychiatric medications as prescribed at discharge - instructions are provided to you in the discharge paperwork.    -Follow-up with outpatient primary care doctor and other specialists -for management of preventative medicine and chronic medical disease, including:   -Testing: Follow-up with outpatient provider for abnormal lab results:  none   -Recommend abstinence from alcohol, tobacco, and other illicit drug use at discharge.    -If your psychiatric  symptoms recur, worsen, or if you have side effects to your psychiatric medications, call your outpatient psychiatric provider, 911, [phone] or go to the nearest emergency department.   -If suicidal thoughts recur, call your outpatient psychiatric provider, 911, 988 or go to the nearest emergency department.   [phone], PA 07/02/2022, 12:31 PM

## 2022-07-02 NOTE — B[facility] Counselor (Addendum)
[facility] LCSW Progress Note   07/02/2022    12:41 PM  Mahmoud Blazejewski   [id number]   Type of Contact and Topic: APS caseworker, guardianship and discharge planning  CSW was made aware that patient has an open APS case open.  APS case worker, Talbert Forest, spoke with this CSW and states that she thought patient was deemed incompetent and had a guardian.  CSW contacted [place], [place] (he was discharged on 9/29) and mother who all report that patient currently does not have a guardian. CSW called mother and discussed that patient would be discharged to a shelter.  Mom discussed concerns and then agreed to have patient come back home. Patient can be discharged to mothers place.  Safety planning reiterated.    CSW tried to call APS worker, Ms. Jimmye Norman, and was unable to leave a message regarding discharge plans.  CSW attempted 2x with no success.  Providers agreeing to discharge patient today due to patient being psychiatrically cleared.   CSW also received a voicemail from [place], [place], for follow up.  CSW returned phone call and left a message for a call back.     SignedRiki Altes, LCSW, LCAS  07/02/2022 12:41 PM

## 2022-08-20 ENCOUNTER — Ambulatory Visit (HOSPITAL_COMMUNITY)
Admission: AD | Admit: 2022-08-20 | Discharge: 2022-08-20 | Disposition: A | Discharge status: Home / Self Care | Payer: Medicaid Other | Attending: Psychiatry | Admitting: Psychiatry

## 2022-08-20 NOTE — H&P (Signed)
  rithy mandley a 21 year old African-American male presents voluntarily as a walk-in to [facility] for complaint of needing money, iPhone, iPhone case, iPad, iPad case, food, shade, wanting a boyfriend and family love.  Made patient aware that we only treat mental health illnesses.  He presents with large luggage of clothing as he is moving into a hotel.  Patient refuses to leave and security was called to assist patient.  His vital signs are within normal limits with temperature 97.5, pulse 69, blood pressure 111/95, and O2 saturation at 100%.  No acute psychiatric or medical distress observed, and patient was discharged and left [facility] without any incident.  Alan Mulder, NP Psychiatry.

## 2023-02-26 ENCOUNTER — Ambulatory Visit (HOSPITAL_COMMUNITY)
Admission: EM | Admit: 2023-02-26 | Discharge: 2023-02-26 | Disposition: A | Discharge status: Home / Self Care | Payer: Medicaid Other | Attending: Psychiatry | Admitting: Psychiatry

## 2023-02-26 DIAGNOSIS — Z79899 Other long term (current) drug therapy: Secondary | ICD-10-CM

## 2023-02-26 DIAGNOSIS — F259 Schizoaffective disorder, unspecified: Secondary | ICD-10-CM | POA: Insufficient documentation

## 2023-02-26 DIAGNOSIS — F329 Major depressive disorder, single episode, unspecified: Secondary | ICD-10-CM | POA: Insufficient documentation

## 2023-02-26 DIAGNOSIS — F840 Autistic disorder: Secondary | ICD-10-CM | POA: Insufficient documentation

## 2023-02-26 DIAGNOSIS — R4589 Other symptoms and signs involving emotional state: Secondary | ICD-10-CM

## 2023-02-26 NOTE — ED Provider Notes (Signed)
Behavioral Health Urgent Care Medical Screening Exam  Patient Name: Alec Williams MRN: [id number] Date of Evaluation: 02/27/23 Chief Complaint:  my medication is not working Diagnosis:  Final diagnoses:  Medication course changed  Anxious appearance    History of Present illness: Alec Williams is a 22 y.o. male. With a history of MDD, suicidal ideation, schizoaffective disorder, autism. Presented to [facility] voluntarily.  Per the patient my medicine is not working and need a long-term program.  When asked what does he mean by a long-term program patient could not explain.  According to patient he is seeing a psychiatrist but patient did not know the name of the psychiatrist however patient stated he is taking Risperdal and trazodone when asked which one of the medicine is not working he says his trazodone because he has difficulty sleeping for a couple of days.  During patient talking he became tearful when asked what this going on patient stop crying. Review of patient records show that patient was last seen October of last year with delusional thoughts.  According to patient he lives at home with his mom, patient is unemployed.  Face-to-face observation of patient, patient is alert and oriented to person and place, speech is clear, maintaining eye contact.  Patient can become tearful during talking however patient would snap out of crying immediately.  Patient denies SI, HI, AVH or paranoia at this time.  Patient denies alcohol use.  Patient reports he smokes cigars.  Patient denies any other illicit drug use at this time.  Patient did give Clinical research associate his mom's number to call his mom however when we try reaching her at the number provided it go straight to voicemail. Wandra Mannan [phone]).  Patient does not appear to be influenced by external or internal stimuli at this time writer discussed with patient that he can return to walking psychiatry to have them refill or change his medication  regiment.  Flowsheet Row ED from 02/26/2023 in [facility] Admission (Discharged) from OP [facility] from 10/19/2019 in [facility] at [facility]  C-SSRS RISK CATEGORY No Risk No Risk No Risk       Psychiatric Specialty Exam  Presentation  General Appearance:Casual  Eye Contact:Good  Speech:Clear and Coherent  Speech Volume:Normal  Handedness:Right   Mood and Affect  Mood: Anxious  Affect: Appropriate   Thought Process  Thought Processes: Coherent  Descriptions of Associations:Circumstantial  Orientation:Full (Time, Place and Person)  Thought Content:WDL  Diagnosis of Schizophrenia or Schizoaffective disorder in past: Yes  Duration of Psychotic Symptoms: Greater than six months  Hallucinations:None  Ideas of Reference:None  Suicidal Thoughts:No  Homicidal Thoughts:No   Sensorium  Memory: Immediate Fair  Judgment: Fair  Insight: Fair   Art therapist  Concentration: Fair  Attention Span: Fair  Recall: Fair  Fund of Knowledge: Good  Language: Good   Psychomotor Activity  Psychomotor Activity: Normal   Assets  Assets: Desire for Improvement   Sleep  Sleep: Fair  Number of hours:  5   Physical Exam: Physical Exam HENT:     Head: Normocephalic.     Nose: Nose normal.  Cardiovascular:     Rate and Rhythm: Normal rate.  Pulmonary:     Effort: Pulmonary effort is normal.  Musculoskeletal:        General: Normal range of motion.     Cervical back: Normal range of motion.  Neurological:     General:  No focal deficit present.     Mental Status: He is alert.  Psychiatric:        Mood and Affect: Mood normal.    Review of Systems  Constitutional: Negative.   HENT: Negative.    Eyes: Negative.   Respiratory: Negative.    Cardiovascular: Negative.   Gastrointestinal: Negative.    Genitourinary: Negative.   Musculoskeletal: Negative.   Skin: Negative.   Neurological: Negative.   Psychiatric/Behavioral:  The patient is nervous/anxious.    Blood pressure 124/75, pulse 79, temperature 98.7 F (37.1 C), temperature source Oral, resp. rate 20, SpO2 100 %. There is no height or weight on file to calculate BMI.  Musculoskeletal: Strength & Muscle Tone: within normal limits Gait & Station: normal Patient leans: N/A   [facility] MSE Discharge Disposition for Follow up and Recommendations: Based on my evaluation the patient does not appear to have an emergency medical condition and can be discharged with resources and follow up care in outpatient services for Medication Management   Sindy Guadeloupe, NP 02/27/2023, 5:56 AM

## 2023-02-26 NOTE — Discharge Instructions (Signed)
F/u with walk-in psychiatry or outpatient PCP

## 2023-02-26 NOTE — Progress Notes (Signed)
   02/26/23 2036  [facility] Triage Screening (Walk-ins at [facility] only)  How Did You Hear About [place]? Self  What Is the Reason for Your Visit/Call Today? Pt presents to [facility] voluntarily, with complaint of medication problems. Pt reports that his medications are not working at this time. Pt reports taking Risperdal and Trazadone, with his last dose being yesterday. Pt also reports some mild confusion at times. Pt stated multiple times " I just need something long term". Pt denies SI,HI,AVH and substance/alcohol use.  How Long Has This Been Causing You Problems? <Week  Have You Recently Had Any Thoughts About Hurting Yourself? No  Are You Planning to Commit Suicide/Harm Yourself At This time? No  Have you Recently Had Thoughts About Hurting Someone Karolee Ohs? No  Are You Planning To Harm Someone At This Time? No  Are you currently experiencing any auditory, visual or other hallucinations? No  Have You Used Any Alcohol or Drugs in the Past 24 Hours? No  Do you have any current medical co-morbidities that require immediate attention? No  Clinician description of patient physical appearance/behavior: pt is calm,cooperative  What Do You Feel Would Help You the Most Today? Treatment for Depression or other mood problem  If access to [facility] was not available, would you have sought care in the Emergency Department? No  Determination of Need Routine (7 days)  Options For Referral Other: Comment;Medication Management;Outpatient Therapy

## 2023-04-13 ENCOUNTER — Emergency Department (HOSPITAL_COMMUNITY)
Admission: EM | Admit: 2023-04-13 | Discharge: 2023-04-13 | Disposition: A | Discharge status: Other Acute Inpatient Hospital | Payer: MEDICAID | Attending: Emergency Medicine | Admitting: Emergency Medicine

## 2023-04-13 ENCOUNTER — Encounter (HOSPITAL_COMMUNITY): Payer: Self-pay | Admitting: Emergency Medicine

## 2023-04-13 ENCOUNTER — Emergency Department (EMERGENCY_DEPARTMENT_HOSPITAL)
Admission: EM | Admit: 2023-04-13 | Discharge: 2023-04-15 | Disposition: A | Discharge status: Other Acute Inpatient Hospital | Payer: MEDICAID | Source: Home / Self Care | Attending: Emergency Medicine | Admitting: Emergency Medicine

## 2023-04-13 ENCOUNTER — Other Ambulatory Visit: Payer: Self-pay

## 2023-04-13 DIAGNOSIS — F1290 Cannabis use, unspecified, uncomplicated: Secondary | ICD-10-CM | POA: Diagnosis present

## 2023-04-13 DIAGNOSIS — Z87891 Personal history of nicotine dependence: Secondary | ICD-10-CM | POA: Insufficient documentation

## 2023-04-13 DIAGNOSIS — F22 Delusional disorders: Secondary | ICD-10-CM | POA: Diagnosis not present

## 2023-04-13 DIAGNOSIS — Z1152 Encounter for screening for COVID-19: Secondary | ICD-10-CM | POA: Insufficient documentation

## 2023-04-13 DIAGNOSIS — F259 Schizoaffective disorder, unspecified: Secondary | ICD-10-CM | POA: Insufficient documentation

## 2023-04-13 DIAGNOSIS — F840 Autistic disorder: Secondary | ICD-10-CM | POA: Insufficient documentation

## 2023-04-13 LAB — COMPREHENSIVE METABOLIC PANEL
ALT: 18 U/L (ref 0–44)
ALT: 18 U/L (ref 0–44)
AST: 22 U/L (ref 15–41)
AST: 21 U/L (ref 15–41)
Albumin: 5 g/dL (ref 3.5–5.0)
Albumin: 5 g/dL (ref 3.5–5.0)
Alkaline Phosphatase: 74 U/L (ref 38–126)
Alkaline Phosphatase: 69 U/L (ref 38–126)
Anion gap: 11 (ref 5–15)
Anion gap: 11 (ref 5–15)
BUN: 12 mg/dL (ref 6–20)
BUN: 12 mg/dL (ref 6–20)
CO2: 25 mmol/L (ref 22–32)
CO2: 25 mmol/L (ref 22–32)
Calcium: 9.8 mg/dL (ref 8.9–10.3)
Calcium: 9.9 mg/dL (ref 8.9–10.3)
Chloride: 103 mmol/L (ref 98–111)
Chloride: 104 mmol/L (ref 98–111)
Creatinine, Ser: 1.07 mg/dL (ref 0.61–1.24)
Creatinine, Ser: 0.96 mg/dL (ref 0.61–1.24)
GFR, Estimated: >60 mL/min (ref >60)
GFR, Estimated: >60 mL/min (ref >60)
Glucose, Bld: 87 mg/dL (ref 70–99)
Glucose, Bld: 84 mg/dL (ref 70–99)
Potassium: 4 mmol/L (ref 3.5–5.1)
Potassium: 4 mmol/L (ref 3.5–5.1)
Sodium: 139 mmol/L (ref 135–145)
Sodium: 140 mmol/L (ref 135–145)
Total Bilirubin: 1 mg/dL (ref 0.3–1.2)
Total Bilirubin: 0.9 mg/dL (ref 0.3–1.2)
Total Protein: 8 g/dL (ref 6.5–8.1)
Total Protein: 8.1 g/dL (ref 6.5–8.1)

## 2023-04-13 LAB — CBC WITH DIFFERENTIAL/PLATELET
Abs Immature Granulocytes: 0.02 10*3/uL (ref 0.00–0.07)
Abs Immature Granulocytes: 0.02 10*3/uL (ref 0.00–0.07)
Basophils Absolute: 0.1 10*3/uL (ref 0.0–0.1)
Basophils Absolute: 0 10*3/uL (ref 0.0–0.1)
Basophils Relative: 1 %
Basophils Relative: 1 %
Eosinophils Absolute: 0.1 10*3/uL (ref 0.0–0.5)
Eosinophils Absolute: 0 10*3/uL (ref 0.0–0.5)
Eosinophils Relative: 1 %
Eosinophils Relative: 1 %
HCT: 46.5 % (ref 39.0–52.0)
HCT: 46.8 % (ref 39.0–52.0)
Hemoglobin: 15.8 g/dL (ref 13.0–17.0)
Hemoglobin: 15.9 g/dL (ref 13.0–17.0)
Immature Granulocytes: 0 %
Immature Granulocytes: 0 %
Lymphocytes Relative: 25 %
Lymphocytes Relative: 24 %
Lymphs Abs: 1.6 10*3/uL (ref 0.7–4.0)
Lymphs Abs: 1.6 10*3/uL (ref 0.7–4.0)
MCH: 31.7 pg (ref 26.0–34.0)
MCH: 31.9 pg (ref 26.0–34.0)
MCHC: 34 g/dL (ref 30.0–36.0)
MCHC: 34 g/dL (ref 30.0–36.0)
MCV: 93.2 fL (ref 80.0–100.0)
MCV: 93.8 fL (ref 80.0–100.0)
Monocytes Absolute: 0.4 10*3/uL (ref 0.1–1.0)
Monocytes Absolute: 0.3 10*3/uL (ref 0.1–1.0)
Monocytes Relative: 5 %
Monocytes Relative: 5 %
Neutro Abs: 4.5 10*3/uL (ref 1.7–7.7)
Neutro Abs: 4.4 10*3/uL (ref 1.7–7.7)
Neutrophils Relative %: 68 %
Neutrophils Relative %: 69 %
Platelets: 264 10*3/uL (ref 150–400)
Platelets: 281 10*3/uL (ref 150–400)
RBC: 4.99 MIL/uL (ref 4.22–5.81)
RBC: 4.99 MIL/uL (ref 4.22–5.81)
RDW: 11.9 % (ref 11.5–15.5)
RDW: 11.9 % (ref 11.5–15.5)
WBC: 6.5 10*3/uL (ref 4.0–10.5)
WBC: 6.4 10*3/uL (ref 4.0–10.5)
nRBC: 0 % (ref 0.0–0.2)
nRBC: 0 % (ref 0.0–0.2)

## 2023-04-13 LAB — ETHANOL
Alcohol, Ethyl (B): <10 mg/dL (ref <10)
Alcohol, Ethyl (B): <10 mg/dL (ref <10)

## 2023-04-13 LAB — RAPID URINE DRUG SCREEN, HOSP PERFORMED
Amphetamines: NOT DETECTED
Barbiturates: NOT DETECTED
Benzodiazepines: NOT DETECTED
Cocaine: NOT DETECTED
Opiates: NOT DETECTED
Tetrahydrocannabinol: POSITIVE — AB

## 2023-04-13 MED ORDER — GUANFACINE HCL 1 MG PO TABS
1.0000 mg | ORAL_TABLET | Freq: Every day | ORAL | Status: DC
  Administered 2023-04-13: 1 mg via ORAL
  Filled 2023-04-13: qty 1

## 2023-04-13 MED ORDER — TRAZODONE HCL 50 MG PO TABS
50.0000 mg | ORAL_TABLET | Freq: Every evening | ORAL | Status: DC | PRN
  Administered 2023-04-13: 50 mg via ORAL
  Filled 2023-04-13: qty 1

## 2023-04-13 MED ORDER — GUANFACINE HCL 1 MG PO TABS: 1.0000 mg | ORAL_TABLET | Freq: Every day | ORAL | Status: DC

## 2023-04-13 MED ORDER — BENZTROPINE MESYLATE 0.5 MG PO TABS: 0.5000 mg | ORAL_TABLET | Freq: Two times a day (BID) | ORAL | Status: DC

## 2023-04-13 MED ORDER — RISPERIDONE 2 MG PO TABS
2.0000 mg | ORAL_TABLET | Freq: Every day | ORAL | Status: DC
  Administered 2023-04-13: 2 mg via ORAL
  Filled 2023-04-13: qty 1

## 2023-04-13 MED ORDER — ALUM & MAG HYDROXIDE-SIMETH 200-200-20 MG/5ML PO SUSP: 30.0000 mL | Freq: Four times a day (QID) | ORAL | Status: DC | PRN

## 2023-04-13 MED ORDER — NICOTINE 14 MG/24HR TD PT24: 14.0000 mg | MEDICATED_PATCH | Freq: Every day | TRANSDERMAL | Status: DC

## 2023-04-13 MED ORDER — TRAZODONE HCL 100 MG PO TABS: 50.0000 mg | ORAL_TABLET | Freq: Every evening | ORAL | Status: DC | PRN

## 2023-04-13 MED ORDER — ONDANSETRON HCL 4 MG PO TABS: 4.0000 mg | ORAL_TABLET | Freq: Three times a day (TID) | ORAL | Status: DC | PRN

## 2023-04-13 MED ORDER — OLANZAPINE 5 MG PO TBDP
5.0000 mg | ORAL_TABLET | Freq: Once | ORAL | Status: AC
  Administered 2023-04-13: 5 mg via ORAL
  Filled 2023-04-13: qty 1

## 2023-04-13 MED ORDER — ACETAMINOPHEN 325 MG PO TABS: 650.0000 mg | ORAL_TABLET | ORAL | Status: DC | PRN

## 2023-04-13 MED ORDER — DIVALPROEX SODIUM ER 500 MG PO TB24: 1500.0000 mg | ORAL_TABLET | Freq: Every day | ORAL | Status: DC

## 2023-04-13 MED ORDER — DIVALPROEX SODIUM ER 500 MG PO TB24
1000.0000 mg | ORAL_TABLET | Freq: Every day | ORAL | Status: DC
  Administered 2023-04-13: 1000 mg via ORAL
  Filled 2023-04-13: qty 2

## 2023-04-13 MED ORDER — LORAZEPAM 0.5 MG PO TABS
0.5000 mg | ORAL_TABLET | Freq: Once | ORAL | Status: AC
  Administered 2023-04-13: 0.5 mg via ORAL
  Filled 2023-04-13: qty 1

## 2023-04-13 MED ORDER — BENZTROPINE MESYLATE 0.5 MG PO TABS
0.5000 mg | ORAL_TABLET | Freq: Every day | ORAL | Status: DC
  Administered 2023-04-13: 0.5 mg via ORAL
  Filled 2023-04-13: qty 1

## 2023-04-13 NOTE — ED Notes (Signed)
Pt was able to give urine sample when asked. Urine sample sent to lab.

## 2023-04-13 NOTE — ED Notes (Signed)
Patient requesting to D/C. MD notified. Patient is voluntary. Patient given belongings back.

## 2023-04-13 NOTE — ED Notes (Signed)
Pt started asking for a bus pass and he wants to leave.  Messaged Dr and NP about same.  Pt was recommended for in-pt treatment and pt continues to ask for a bus pass to leave.  I have explained to pt that he needs to be treated.  Pt agreeable at this point.    Also pt changed rooms again back to room 34.

## 2023-04-13 NOTE — ED Notes (Signed)
Pt's belongings placed in bag and stored in cabinet 16-18 resus A behind nurses station; pt's belongings include shirt, pants, underwear, socks, and shoes.

## 2023-04-13 NOTE — ED Triage Notes (Signed)
Patient BIB [employer] for voluntary evaluation.  Hx os schizophrenia and family is not sure if she is compliant with medications.  Pt called officers tonight because "aircraft were flying around and dropping crickets everything." Believe he is the leader of the Nazi and he was talking about the [employer].  No reports of SI/HI

## 2023-04-13 NOTE — ED Provider Notes (Signed)
[place] EMERGENCY DEPARTMENT AT [facility] Provider Note   CSN: [id number] Arrival date & time: 04/13/23  6295     History  Chief Complaint  Patient presents with   Psychiatric Evaluation   foot complaint    Alec Williams is a 22 y.o. male.  22 year old male presents with bizarre behavior.  Patient had been seen by behavioral health and they recommend admission.  Prior EDP felt that patient was not committable and patient wanted to leave so patient was allowed to.  Patient returned to the hospital due to continued racing thoughts.  History is limited due to his current state       Home Medications Prior to Admission medications   Medication Sig Start Date End Date Taking? Authorizing Provider  benztropine (COGENTIN) 0.5 MG tablet Take 1 tablet (0.5 mg total) by mouth 2 (two) times daily. 07/02/22 08/01/22  Massengill, Harrold Donath, MD  divalproex (DEPAKOTE ER) 500 MG 24 hr tablet Take 3 tablets (1,500 mg total) by mouth at bedtime. 07/02/22 08/01/22  Massengill, Harrold Donath, MD  guanFACINE (TENEX) 1 MG tablet Take 1 tablet (1 mg total) by mouth at bedtime. 07/02/22 08/01/22  Massengill, Harrold Donath, MD  mineral oil-hydrophilic petrolatum (AQUAPHOR) ointment Apply topically daily at 6 (six) AM. 07/03/22   Massengill, Harrold Donath, MD  nicotine polacrilex (NICORETTE) 2 MG gum Take 1 each (2 mg total) by mouth as needed for smoking cessation. 07/02/22   Massengill, Harrold Donath, MD  risperiDONE (RISPERDAL) 1 MG tablet Take 1 tablet (1 mg total) by mouth as directed for 23 doses. For days 1-3, take 2 tablets in the morning and 2 tablets in the evening. For days 4-6, take 1 tablet in the morning and 1 tablet in the evening.  For days 7-9, take 1 tablet in the evening. For days 10-14, take 0.5 tablet in the evening. Then stop. 07/03/22   Massengill, Harrold Donath, MD  risperiDONE ER (UZEDY) 100 MG/0.28ML SUSY Inject 100 mg into the skin once for 1 dose. Next administration is due on 07-26-2022.  07/26/22 07/26/22  Phineas Inches, MD  Scar Treatment Products [facility]) GEL Apply 1 Application topically daily. 07/03/22   Massengill, Harrold Donath, MD  traZODone (DESYREL) 50 MG tablet Take 1 tablet (50 mg total) by mouth at bedtime as needed for sleep. 07/02/22 08/01/22  Phineas Inches, MD      Allergies    Patient has no known allergies.    Review of Systems   Review of Systems  All other systems reviewed and are negative.   Physical Exam Updated Vital Signs BP 117/62 (BP Location: Left Arm)   Pulse 74   Temp 98 F (36.7 C)   Resp 16   SpO2 99%  Physical Exam Vitals and nursing note reviewed.  Constitutional:      General: He is not in acute distress.    Appearance: Normal appearance. He is well-developed. He is not toxic-appearing.  HENT:     Head: Normocephalic and atraumatic.  Eyes:     General: Lids are normal.     Conjunctiva/sclera: Conjunctivae normal.     Pupils: Pupils are equal, round, and reactive to light.  Neck:     Thyroid: No thyroid mass.     Trachea: No tracheal deviation.  Cardiovascular:     Rate and Rhythm: Normal rate and regular rhythm.     Heart sounds: Normal heart sounds. No murmur heard.    No gallop.  Pulmonary:     Effort: Pulmonary effort is normal. No respiratory distress.  Breath sounds: Normal breath sounds. No stridor. No decreased breath sounds, wheezing, rhonchi or rales.  Abdominal:     General: There is no distension.     Palpations: Abdomen is soft.     Tenderness: There is no abdominal tenderness. There is no rebound.  Musculoskeletal:        General: No tenderness. Normal range of motion.     Cervical back: Normal range of motion and neck supple.  Skin:    General: Skin is warm and dry.     Findings: No abrasion or rash.  Neurological:     Mental Status: He is alert and oriented to person, place, and time. Mental status is at baseline.     GCS: GCS eye subscore is 4. GCS verbal subscore is 5. GCS motor subscore is  6.     Cranial Nerves: Cranial nerves are intact. No cranial nerve deficit.     Sensory: No sensory deficit.     Motor: Motor function is intact.  Psychiatric:        Attention and Perception: He is inattentive.        Mood and Affect: Affect is inappropriate.        Speech: Speech is delayed.        Behavior: Behavior is withdrawn.     ED Results / Procedures / Treatments   Labs (all labs ordered are listed, but only abnormal results are displayed) Labs Reviewed  COMPREHENSIVE METABOLIC PANEL  RAPID URINE DRUG SCREEN, HOSP PERFORMED  CBC WITH DIFFERENTIAL/PLATELET  ETHANOL    EKG None  Radiology No results found.  Procedures Procedures    Medications Ordered in ED Medications - No data to display  ED Course/ Medical Decision Making/ A&P                                 Medical Decision Making Amount and/or Complexity of Data Reviewed Labs: ordered.   Patient will have labs performed.  Suspect that he will be medically cleared.  Psychiatry informed and will see patient        Final Clinical Impression(s) / ED Diagnoses Final diagnoses:  None    Rx / DC Orders ED Discharge Orders     None         Lorre Nick, MD 04/13/23 1038

## 2023-04-13 NOTE — ED Notes (Signed)
Per TTS: "Disposition Cecilio Asper, NP recommends inpatient treatment, CSW to seek placement if no available beds at [facility]"

## 2023-04-13 NOTE — ED Notes (Signed)
[employer] who brought patient in asked this RN if they were okay to leave or if the patient was being involuntarily committed. [employer] stated patient came voluntarily and was the one who called them. Spoke with Dr. Preston Fleeting who stated the patient was not a risk to himself or others and was not going to be involuntarily committed. Consulting civil engineer notified. [employer] left ED at this time.   Dr. Preston Fleeting stated pt was medically cleared for TTS consult.

## 2023-04-13 NOTE — ED Notes (Addendum)
TTS provider speaking with patient at this time.

## 2023-04-13 NOTE — ED Notes (Signed)
Patient given bus pass.

## 2023-04-13 NOTE — Consult Note (Signed)
[facility] ED ASSESSMENT   Reason for Consult:  Psychiatry evaluation Referring Physician:  ER Physician Patient Identification: Alec Williams MRN:  [id number] ED Chief Complaint: Schizoaffective disorder [facility])  Diagnosis:  Principal Problem:   Schizoaffective disorder (HCC) Active Problems:   Cannabis use disorder   ED Assessment Time Calculation: Start Time: 1823 Stop Time: 1856 Total Time in Minutes (Assessment Completion): 33   Subjective:   Alec Williams is a 22 y.o. male patient admitted with hx of MDD, Cannabis use disorder, Schizoaffective disorder.came to the ER this morning wearing no shirt and appeared disoriented.  Shoes were on the wrong foot and he was not able to state what was going on with him.   Patient was brought to the ER earlier last night by [employer] whom he called to bring him to the ER.  After a while he asked to be discharged and was discharge,.  HPI:  Patient on arrival this morning was disorganized, talkative but made no sense.  Record from initial visit states patient stated that he came to the ER because his Brain was not functioning and that he has a worm in his Arlys John.  UDS done at the time was negative.  When he returned he presented bizarre and confused behavior.  Patient was disorganized, unable to make a complete a sentence.  Patient would not stay in his room instead was in front of the  nursing station and occasionally laughing inappropriately.  Efforts to redirect  patient became difficult.  This provider ordered Olanzapine and Ativan which was given and patient slept for few hours. 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  He states he is unemployed because he is a Consulting civil engineer. Collateral information from mother Alec Williams is that yesterday as they were celebrating patient's birthday  he became paranoid, agitated and talking off his head.   She  states patient has an Systems developer intervention and is on Monthly injection but does not know when he had his last injection.  Mother states patient is not a Consulting civil engineer and is unemployed.  She reports patient was in jail for trespassing and later transferred to [facility] where he spent two years and his jail case was dropped.  She reports that patient continues  to act out, lash out at people including his Twin brother.  She says patient remains Pranoid most of the time and uses Cannabis off and on. Patient will be admitted, home Medications are resumed.  Strategic intervention will be contacted in the morning for accurate medication list.  We will fax out records for bed placement.   Patient denies SI/HI/AVH and does not appear as if he is responding to internal stimuli.  UDS is positive for Cannabis when he came back this morning.  Past Psychiatric History: MDD, Cannabis use disorder, Schizoaffective disorder.  Patient was hospitalized at [facility] October last year., 2019   He spent 2 years at [facility]. Multiple ER visits at [facility] and [facility] as well.  He has ACT team [facility].  Hx of self injurious behavior  Risk to Self or Others: Is the patient at risk to self? No Has the patient been a risk to self in the past 6 months? No Has the patient been a risk to self within the distant past? No Is the patient a risk to others? No Has the patient been a risk  to others in the past 6 months? No Has the patient been a risk to others within the distant past? No  [place] Scale:  Flowsheet Row ED from 04/13/2023 in [facility] Emergency Department at [facility] Most recent reading at 04/13/2023  9:19 AM ED from 04/13/2023 in [facility] Emergency Department at [facility] Most recent reading at 04/13/2023  4:28 AM ED from 02/26/2023 in [facility] Most recent reading at 02/26/2023  8:44 PM  C-SSRS RISK CATEGORY No Risk No Risk No Risk       AIMS:  , , ,  ,   ASAM:     Substance Abuse:     Past Medical History:  Past Medical History:  Diagnosis Date   Schizophrenia (HCC)    No past surgical history on file. Family History: No family history on file. Family Psychiatric  History: unknown by patient. Social History:  Social History   Substance and Sexual Activity  Alcohol Use No     Social History   Substance and Sexual Activity  Drug Use No    Social History   Socioeconomic History   Marital status: Single    Spouse name: Not on file   Number of children: Not on file   Years of education: Not on file   Highest education level: Not on file  Occupational History   Not on file  Tobacco Use   Smoking status: Former   Smokeless tobacco: Former   Tobacco comments:    "I smoke black and mild"  Vaping Use   Vaping status: Every Day  Substance and Sexual Activity   Alcohol use: No   Drug use: No   Sexual activity: Not on file  Other Topics Concern   Not on file  Social History Narrative   Not on file   Social Determinants of Health   Financial Resource Strain: Not on file  Food Insecurity: No Food Insecurity (06/24/2022)   Hunger Vital Sign    Worried About Running Out of Food in the Last Year: Never true    Ran Out of Food in the Last Year: Never true  Transportation Needs: No Transportation Needs (06/24/2022)   PRAPARE - Administrator, Civil Service (Medical): No    Lack of Transportation (Non-Medical): No  Physical Activity: Not on file  Stress: Not on file  Social Connections: Not on file   Additional Social History:    Allergies:  No Known Allergies  Labs:  Results for orders placed or performed during the hospital encounter of 04/13/23 (from the past 48 hour(s))  Comprehensive metabolic panel     Status: None   Collection Time: 04/13/23  9:33 AM  Result Value Ref Range   Sodium 140 135 - 145 mmol/L   Potassium 4.0 3.5 - 5.1 mmol/L   Chloride 104 98 - 111 mmol/L   CO2 25 22 - 32 mmol/L   Glucose,  Bld 84 70 - 99 mg/dL    Comment: Glucose reference range applies only to samples taken after fasting for at least 8 hours.   BUN 12 6 - 20 mg/dL   Creatinine, Ser 8.65 0.61 - 1.24 mg/dL   Calcium 9.9 8.9 - 78.4 mg/dL   Total Protein 8.1 6.5 - 8.1 g/dL   Albumin 5.0 3.5 - 5.0 g/dL   AST 21 15 - 41 U/L   ALT 18 0 - 44 U/L   Alkaline Phosphatase 69 38 - 126 U/L  Total Bilirubin 0.9 0.3 - 1.2 mg/dL   GFR, Estimated >29 >52 mL/min    Comment: (NOTE) Calculated using the CKD-EPI Creatinine Equation (2021)    Anion gap 11 5 - 15    Comment: Performed at [facility], [address], [place], [place] [address]  CBC with Differential/Platelet     Status: None   Collection Time: 04/13/23  9:33 AM  Result Value Ref Range   WBC 6.4 4.0 - 10.5 K/uL   RBC 4.99 4.22 - 5.81 MIL/uL   Hemoglobin 15.9 13.0 - 17.0 g/dL   HCT 44.0 10.2 - 72.5 %   MCV 93.8 80.0 - 100.0 fL   MCH 31.9 26.0 - 34.0 pg   MCHC 34.0 30.0 - 36.0 g/dL   RDW 36.6 44.0 - 34.7 %   Platelets 281 150 - 400 K/uL   nRBC 0.0 0.0 - 0.2 %   Neutrophils Relative % 69 %   Neutro Abs 4.4 1.7 - 7.7 K/uL   Lymphocytes Relative 24 %   Lymphs Abs 1.6 0.7 - 4.0 K/uL   Monocytes Relative 5 %   Monocytes Absolute 0.3 0.1 - 1.0 K/uL   Eosinophils Relative 1 %   Eosinophils Absolute 0.0 0.0 - 0.5 K/uL   Basophils Relative 1 %   Basophils Absolute 0.0 0.0 - 0.1 K/uL   Immature Granulocytes 0 %   Abs Immature Granulocytes 0.02 0.00 - 0.07 K/uL    Comment: Performed at [facility], [address] [place], [place] [address]  Ethanol     Status: None   Collection Time: 04/13/23  9:33 AM  Result Value Ref Range   Alcohol, Ethyl (B) <10 <10 mg/dL    Comment: (NOTE) Lowest detectable limit for serum alcohol is 10 mg/dL.  For medical purposes only. Performed at [facility], [address], [place], [place] [address]   Rapid urine drug screen (hospital performed)     Status:  Abnormal   Collection Time: 04/13/23 10:29 AM  Result Value Ref Range   Opiates NONE DETECTED NONE DETECTED   Cocaine NONE DETECTED NONE DETECTED   Benzodiazepines NONE DETECTED NONE DETECTED   Amphetamines NONE DETECTED NONE DETECTED   Tetrahydrocannabinol POSITIVE (A) NONE DETECTED   Barbiturates NONE DETECTED NONE DETECTED    Comment: (NOTE) DRUG SCREEN FOR MEDICAL PURPOSES ONLY.  IF CONFIRMATION IS NEEDED FOR ANY PURPOSE, NOTIFY LAB WITHIN 5 DAYS.  LOWEST DETECTABLE LIMITS FOR URINE DRUG SCREEN Drug Class                     Cutoff (ng/mL) Amphetamine and metabolites    1000 Barbiturate and metabolites    200 Benzodiazepine                 200 Opiates and metabolites        300 Cocaine and metabolites        300 THC                            50 Performed at [facility], [address] [place], [place] [address]     Current Facility-Administered Medications  Medication Dose Route Frequency Provider Last Rate Last Admin   benztropine (COGENTIN) tablet 0.5 mg  0.5 mg Oral QHS Leviathan Macera C, NP       divalproex (DEPAKOTE ER) 24 hr tablet 1,000 mg  1,000 mg Oral QHS Dahlia Byes  C, NP       guanFACINE (TENEX) tablet 1 mg  1 mg Oral QHS Tracy Kinner C, NP       risperiDONE (RISPERDAL) tablet 2 mg  2 mg Oral QHS Kiwanna Spraker C, NP       traZODone (DESYREL) tablet 50 mg  50 mg Oral QHS PRN Earney Navy, NP       Current Outpatient Medications  Medication Sig Dispense Refill   benztropine (COGENTIN) 0.5 MG tablet Take 1 tablet (0.5 mg total) by mouth 2 (two) times daily. 60 tablet 0   divalproex (DEPAKOTE ER) 500 MG 24 hr tablet Take 3 tablets (1,500 mg total) by mouth at bedtime. 90 tablet 0   guanFACINE (TENEX) 1 MG tablet Take 1 tablet (1 mg total) by mouth at bedtime. 30 tablet 0   mineral oil-hydrophilic petrolatum (AQUAPHOR) ointment Apply topically daily at 6 (six) AM. 420 g 0   nicotine polacrilex (NICORETTE) 2 MG gum  Take 1 each (2 mg total) by mouth as needed for smoking cessation. 100 tablet 0   risperiDONE (RISPERDAL) 1 MG tablet Take 1 tablet (1 mg total) by mouth as directed for 23 doses. For days 1-3, take 2 tablets in the morning and 2 tablets in the evening. For days 4-6, take 1 tablet in the morning and 1 tablet in the evening.  For days 7-9, take 1 tablet in the evening. For days 10-14, take 0.5 tablet in the evening. Then stop. 23 tablet 0   risperiDONE ER (UZEDY) 100 MG/0.28ML SUSY Inject 100 mg into the skin once for 1 dose. Next administration is due on 07-26-2022. 0.28 mL 0   Scar Treatment Products (MEDERMA) GEL Apply 1 Application topically daily.  0   traZODone (DESYREL) 50 MG tablet Take 1 tablet (50 mg total) by mouth at bedtime as needed for sleep. 30 tablet 0    Musculoskeletal: Strength & Muscle Tone: within normal limits Gait & Station: normal Patient leans: Front   Psychiatric Specialty Exam: Presentation  General Appearance:  Casual  Eye Contact: Good  Speech: Clear and Coherent; Normal Rate  Speech Volume: Normal  Handedness: Right   Mood and Affect  Mood: Euphoric  Affect: Congruent; Full Range   Thought Process  Thought Processes: Disorganized; Irrevelant  Descriptions of Associations:Tangential  Orientation:Partial  Thought Content:Illogical  History of Schizophrenia/Schizoaffective disorder:Yes  Duration of Psychotic Symptoms:Greater than six months  Hallucinations:Hallucinations: None  Ideas of Reference:None  Suicidal Thoughts:Suicidal Thoughts: No  Homicidal Thoughts:Homicidal Thoughts: No   Sensorium  Memory: Immediate Poor; Remote Poor; Recent Poor  Judgment: Impaired  Insight: Lacking   Executive Functions  Concentration: Fair  Attention Span: Fair  Recall: Poor  Fund of Knowledge: Poor  Language: Fair   Psychomotor Activity  Psychomotor Activity: Psychomotor Activity: Normal   Assets   Assets: Manufacturing systems engineer; Housing; Physical Health    Sleep  Sleep: Sleep: Fair   Physical Exam: Physical Exam Vitals and nursing note reviewed.  Constitutional:      Appearance: Normal appearance.  HENT:     Head: Normocephalic.     Nose: Nose normal.  Cardiovascular:     Rate and Rhythm: Bradycardia present.  Pulmonary:     Effort: Pulmonary effort is normal.  Musculoskeletal:        General: Normal range of motion.     Cervical back: Normal range of motion.  Skin:    General: Skin is dry.  Neurological:     Mental Status: He is  alert and oriented to person, place, and time.  Psychiatric:        Attention and Perception: He is inattentive.        Mood and Affect: Mood is anxious and elated.        Speech: Speech is tangential.        Behavior: Behavior is cooperative.        Thought Content: Thought content is paranoid and delusional.        Cognition and Memory: Cognition is impaired. Memory is impaired.        Judgment: Judgment is inappropriate.    Review of Systems  Constitutional: Negative.   HENT: Negative.    Eyes: Negative.   Respiratory: Negative.    Cardiovascular: Negative.   Gastrointestinal: Negative.   Genitourinary: Negative.   Musculoskeletal: Negative.   Skin: Negative.   Neurological: Negative.   Endo/Heme/Allergies: Negative.   Psychiatric/Behavioral:  Positive for substance abuse. The patient is nervous/anxious.   - Blood pressure 121/80, pulse (!) 59, temperature 98 F (36.7 C), resp. rate 18, SpO2 100%. There is no height or weight on file to calculate BMI.  Medical Decision Making: Patient will be admitted, home Medications are resumed.  Strategic intervention will be contacted in the morning for accurate medication list.  We will fax out records for bed placement.   Patient denies SI/HI/AVH and does not appear as if he is responding to internal stimuli.  UDS is positive for Cannabis when he came back this morning.  Patient is on  risperiDONE ER (UZEDY) 100 MG/0.28ML SUSY but not sure when he got the injection last.  Provider will contact ACT team tomorrow morning.  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  Earney Navy, NP-PMHNP-BC 04/13/2023 7:19 PM

## 2023-04-13 NOTE — ED Provider Notes (Signed)
[place] EMERGENCY DEPARTMENT AT [facility] Provider Note   CSN: [id number] Arrival date & time: 04/13/23  0421     History  Chief Complaint  Patient presents with   Psychiatric Evaluation    Alec Williams is a 22 y.o. male.  The history is provided by the patient and the police. The history is limited by the condition of the patient (Psychiatric disorder).  He has a history schizoaffective disorder, autism and was brought in by police for voluntary evaluation.  I cannot get much history from the patient because he states that he does not know how to talk to white people.  He states that he is not currently taking any psychiatric medications.  He denies suicidal and homicidal ideation.  He denies hallucinations.  He denies paranoid ideation.  However, he told officers that there were aircraft following around and dropping crickets and that he believed that he was liter of the [employer].  He denies ethanol and drug use.   Home Medications Prior to Admission medications   Medication Sig Start Date End Date Taking? Authorizing Provider  benztropine (COGENTIN) 0.5 MG tablet Take 1 tablet (0.5 mg total) by mouth 2 (two) times daily. 07/02/22 08/01/22  Massengill, Harrold Donath, MD  divalproex (DEPAKOTE ER) 500 MG 24 hr tablet Take 3 tablets (1,500 mg total) by mouth at bedtime. 07/02/22 08/01/22  Massengill, Harrold Donath, MD  guanFACINE (TENEX) 1 MG tablet Take 1 tablet (1 mg total) by mouth at bedtime. 07/02/22 08/01/22  Massengill, Harrold Donath, MD  mineral oil-hydrophilic petrolatum (AQUAPHOR) ointment Apply topically daily at 6 (six) AM. 07/03/22   Massengill, Harrold Donath, MD  nicotine polacrilex (NICORETTE) 2 MG gum Take 1 each (2 mg total) by mouth as needed for smoking cessation. 07/02/22   Massengill, Harrold Donath, MD  risperiDONE (RISPERDAL) 1 MG tablet Take 1 tablet (1 mg total) by mouth as directed for 23 doses. For days 1-3, take 2 tablets in the morning and 2 tablets in the evening. For days  4-6, take 1 tablet in the morning and 1 tablet in the evening.  For days 7-9, take 1 tablet in the evening. For days 10-14, take 0.5 tablet in the evening. Then stop. 07/03/22   Massengill, Harrold Donath, MD  risperiDONE ER (UZEDY) 100 MG/0.28ML SUSY Inject 100 mg into the skin once for 1 dose. Next administration is due on 07-26-2022. 07/26/22 07/26/22  Phineas Inches, MD  [employer] [facility]) GEL Apply 1 Application topically daily. 07/03/22   Massengill, Harrold Donath, MD  traZODone (DESYREL) 50 MG tablet Take 1 tablet (50 mg total) by mouth at bedtime as needed for sleep. 07/02/22 08/01/22  Phineas Inches, MD      Allergies    Patient has no known allergies.    Review of Systems   Review of Systems  Unable to perform ROS: Psychiatric disorder    Physical Exam Updated Vital Signs BP 116/77 (BP Location: Right Arm)   Pulse (!) 56   Temp 98 F (36.7 C) (Oral)   Resp 17   Ht 5\' 4"  (1.626 m)   Wt 57.8 kg   SpO2 100%   BMI 21.87 kg/m  Physical Exam Vitals and nursing note reviewed.   22 year old male, resting comfortably and in no acute distress. Vital signs are normal. Oxygen saturation is 100%, which is normal. Head is normocephalic and atraumatic. PERRLA, EOMI. Oropharynx is clear. Neck is nontender and supple without adenopathy or JVD. Back is nontender and there is no CVA tenderness. Lungs are  clear without rales, wheezes, or rhonchi. Chest is nontender. Heart has regular rate and rhythm without murmur. Abdomen is soft, flat, nontender without masses or hepatosplenomegaly and peristalsis is normoactive. Extremities have no cyanosis or edema, full range of motion is present. Skin is warm and dry without rash. Neurologic: Awake and alert, cranial nerves are intact, there are no motor or sensory deficits. Psychiatric: Flat affect, does not appear to be responding to internal stimuli.  ED Results / Procedures / Treatments    Procedures Procedures     Medications Ordered in ED Medications - No data to display  ED Course/ Medical Decision Making/ A&P                                 Medical Decision Making Risk OTC drugs. Prescription drug management.   Schizoaffective disorder with probable medication noncompliance.  He currently is not a threat to himself or others but may benefit from psychiatric intervention.  I am requesting TTS consultation.  I have reviewed his past records, and he was admitted at [facility] from 06/24/2022-06/28/2022 for schizoaffective disorder and major depressive disorder with psychotic features.  TTS consultation is appreciated.  Patient qualifies for inpatient psychiatric care.  He is still voluntary, no indication for involuntary commitment.  Final Clinical Impression(s) / ED Diagnoses Final diagnoses:  Schizoaffective disorder, unspecified type [facility])    Rx / DC Orders ED Discharge Orders     None         Dione Booze, MD 04/13/23 [phone]

## 2023-04-13 NOTE — BH Assessment (Addendum)
Comprehensive Clinical Assessment (CCA) Note  04/13/2023 Alec Williams [id number]  Disposition: Cecilio Asper, NP recommends inpatient treatment, CSW to seek placement if no available beds at [facility]. Disposition discussed with Dr. Dione Booze and Geofrey D. Borreros, Charity fundraiser.   The patient demonstrates the following risk factors for suicide: Chronic risk factors for suicide include: psychiatric disorder of Schizoaffective disorder (HCC) . Acute risk factors for suicide include: N/A. Protective factors for this patient include: positive social support and Pt denies, SI . Considering these factors, the overall suicide risk at this point appears to be no risk. Patient is not appropriate for outpatient follow up.  Alec Williams is a 22 year old male who presents voluntary and unaccompanied to [facility]. 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 Pt reports, a doctor told him he has worms in his brain he then reports this clinician told him he has worms in his brain. Clinician expressed she did not tell him he has worms in his brains. Pt replied, really, after a pause, he reported, he does not have worms in his brain. Pt reports, "I just want to talk to my blacks." Pt reports, he has metals in his body by swallowing 5, 605 metal chips and a diamond/gold ring he feel in his feet. Pt reports, he was Allah (God), there are three Allah's. Pt reports, he came to the hospital for the scrubs but needs shoes. Pt reports, he needs to go ona vacation he never been on one. Pt reports, he family knows he's at the hospital. Pt denies, SI, HI, AVH, self-injurious behaviors and acces to weapons.   Pt denies, substance use. Pt's UDS is pending. Pt denies, being linked to OPT resources (medication management and/or counseling.)   Pt looked  at his armband when clinician asked for his name and date of birth. Pt reports, his birthday was [birth year] then said it was April 08, 2029. Pt's birthday is 10-26-2000.  During the assessment pt would go back and forth with his answers to questions. During the assessment answered few questions and appeared to be responding to internal stimuli and/or delusional thought content. Pt's mood was euthymic. Pt's affect was congruent. Pt's insight was poor. Pt's judgement was poor. Pt reports, if discharged he can contract for safety.   Chief Complaint:  Chief Complaint  Patient presents with   Psychiatric Evaluation   Visit Diagnosis: Schizoaffective disorder (HCC).   CCA Screening, Triage and Referral (STR)  Patient Reported Information How did you hear about [place]? Legal System  What Is the Reason for Your Visit/Call Today? 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 Per pt, he's Allah and born in the future. Pt reports, swallowing metal and feeling it in his feet. Pt denies, SI, HI, AVH, self-injurious behaviors and access to weapons.  How Long Has This Been Causing You Problems? <Week  What Do You Feel Would Help You the Most Today? Medication(s); Stress Management   Have You Recently Had Any Thoughts About Hurting Yourself? No  Are You Planning to Commit Suicide/Harm Yourself At This time? No   Flowsheet Row ED from 04/13/2023 in [facility] Emergency Department at [facility] ED from 02/26/2023 in [facility] Admission (Discharged) from  [facility]  C-SSRS RISK CATEGORY No Risk No Risk No Risk       Have you Recently Had Thoughts About Hurting Someone Karolee Ohs? No  Are You Planning to Harm Someone at This Time? No  Explanation: Pt denies, HI.   Have You Used Any Alcohol or Drugs in the Past 24 Hours? No (Pt denies.)  What Did You Use and How  Much? Pt denies, substance use.   Do You Currently Have a Therapist/Psychiatrist? No  Name of Therapist/Psychiatrist: Name of Therapist/Psychiatrist: Pt denies, being linked to outpatient resources.   Have You Been Recently Discharged From Any Office Practice or Programs? -- (UTA during the assessment answered few questions and appeared to be responding to internal stimuli and/or delusional thought content.)  Explanation of Discharge From Practice/Program: UTA during the assessment answered few questions and appeared to be responding to internal stimuli and/or delusional thought content.     CCA Screening Triage Referral Assessment Type of Contact: Tele-Assessment  Telemedicine Service Delivery: Telemedicine service delivery: This service was provided via telemedicine using a 2-way, interactive audio and video technology  Is this Initial or Reassessment? Is this Initial or Reassessment?: Initial Assessment  Date Telepsych consult ordered in [facility]:  Date Telepsych consult ordered in [facility]: 04/13/23  Time Telepsych consult ordered in [facility]:  Time Telepsych consult ordered in [facility]: [id number]  Location of Assessment: [facility] ED  Provider Location: [facility] Assessment Services   Collateral Involvement: None.   Does Patient Have a Automotive engineer Guardian? No Mother  Legal Guardian Contact Information: Pt is his own guardian.  Copy of Legal Guardianship Form: -- (Pt is his own guardian.)  Legal Guardian Notified of Arrival: -- (Pt is his own guardian.)  Legal Guardian Notified of Pending Discharge: -- (Pt is his own guardian.)  If Minor and Not Living with Parent(s), Who has Custody? Pt is an adult and his own guardian.  Is CPS involved or ever been involved? -- (UTA during the assessment answered few questions and appeared to be responding to internal stimuli and/or delusional thought content.)  Is APS involved or ever been involved? -- (UTA during the assessment answered few questions  and appeared to be responding to internal stimuli and/or delusional thought content.)   Patient Determined To Be At Risk for Harm To Self or Others Based on Review of Patient Reported Information or Presenting Complaint? No  Method: No Plan  Availability of Means: No access or NA  Intent: Vague intent or NA  Notification Required: No need or identified person  Additional Information for Danger to Others Potential: -- (Pt denies, HI.)  Additional Comments for Danger to Others Potential: Pt denies, HI.  Are There Guns or Other Weapons in Your Home? -- (Pt denies, access to weapons.)  Types of Guns/Weapons: Pt denies, access to weapons.  Are These Weapons Safely Secured?                            -- (Pt denies, access to weapons.)  Who Could Verify You Are Able To Have These Secured: Pt denies, access to weapons.  Do You Have any Outstanding Charges, Pending Court Dates, Parole/Probation? Pt denies, legal involvement.  Contacted To Inform of Risk of Harm To Self or Others: Other: Comment (None.)    Does Patient Present under Involuntary Commitment? No    [place] of Residence: [place]   Patient Currently Receiving  the Following Services: Not Receiving Services   Determination of Need: Emergent (2 hours)   Options For Referral: Inpatient Hospitalization     CCA Biopsychosocial Patient Reported Schizophrenia/Schizoaffective Diagnosis in Past: Yes   Strengths: Pt reports, wanting to take care of his family.   Mental Health Symptoms Depression:   Difficulty Concentrating; Hopelessness; Worthlessness (Isolation.)   Duration of Depressive symptoms:  Duration of Depressive Symptoms: Greater than two weeks   Mania:   None   Anxiety:    Worrying   Psychosis:   Delusions; Hallucinations   Duration of Psychotic symptoms:  Duration of Psychotic Symptoms: -- (UTA during the assessment answered few questions and appeared to be responding to internal stimuli  and/or delusional thought content.)   Trauma:   None   Obsessions:   Poor insight   Compulsions:   Poor Insight   Inattention:   Disorganized; Forgetful   Hyperactivity/Impulsivity:   Feeling of restlessness   Oppositional/Defiant Behaviors:  None   Emotional Irregularity:   None   Other Mood/Personality Symptoms:   None.    Mental Status Exam Appearance and self-care  Stature:   Small   Weight:   Average weight   Clothing:   -- (Pt in scrubs.)   Grooming:   Normal   Cosmetic use:   None   Posture/gait:   Normal   Motor activity:   Not Remarkable   Sensorium  Attention:   Inattentive; Distractible   Concentration:   Scattered   Orientation:   Person; Place   Recall/memory:   Defective in Immediate   Affect and Mood  Affect:   Congruent   Mood:   Euthymic   Relating  Eye contact:   Normal   Facial expression:   Responsive   Attitude toward examiner:   Cooperative   Thought and Language  Speech flow:  Other (Comment) (Tangential.)   Thought content:   Delusions   Preoccupation:   None   Hallucinations:   None   Organization:   Circumstantial; Disorganized   Company secretary of Knowledge:   Poor   Intelligence:   Average   Abstraction:   Functional   Judgement:   Poor   Reality Testing:   -- (UTA during the assessment answered few questions and appeared to be responding to internal stimuli and/or delusional thought content.)   Insight:   Poor   Decision Making:   Impulsive   Social Functioning  Social Maturity:   Isolates   Social Judgement:   Heedless   Stress  Stressors:   Other (Comment) (UTA during the assessment answered few questions and appeared to be responding to internal stimuli and/or delusional thought content.)   Coping Ability:   -- (UTA during the assessment answered few questions and appeared to be responding to internal stimuli and/or delusional thought content.)    Skill Deficits:   Communication; Decision making   Supports:   Family     Religion: Religion/Spirituality Are You A Religious Person?:  (Pt reports, he's Allah.) How Might This Affect Treatment?: None.  Leisure/Recreation: Leisure / Recreation Do You Have Hobbies?:  (UTA during the assessment answered few questions and appeared to be responding to internal stimuli and/or delusional thought content.)  Exercise/Diet: Exercise/Diet Do You Exercise?:  (UTA during the assessment answered few questions and appeared to be responding to internal stimuli and/or delusional thought content.) Have You Gained or Lost A Significant Amount of Weight in the Past Six Months?:  (UTA during the assessment answered  few questions and appeared to be responding to internal stimuli and/or delusional thought content.) Do You Follow a Special Diet?:  (UTA during the assessment answered few questions and appeared to be responding to internal stimuli and/or delusional thought content.) Do You Have Any Trouble Sleeping?: No   CCA Employment/Education Employment/Work Situation: Employment / Work Situation Employment Situation: Unemployed Patient's Job has Been Impacted by Current Illness: No Has Patient ever Been in Equities trader?: No  Education: Education Is Patient Currently Attending School?:  (UTA during the assessment answered few questions and appeared to be responding to internal stimuli and/or delusional thought content.) Last Grade Completed:  (UTA during the assessment answered few questions and appeared to be responding to internal stimuli and/or delusional thought content.) Did You Attend College?:  (UTA during the assessment answered few questions and appeared to be responding to internal stimuli and/or delusional thought content.) Did You Have An Individualized Education Program (IIEP):  (UTA during the assessment answered few questions and appeared to be responding to internal stimuli and/or  delusional thought content.) Did You Have Any Difficulty At School?:  (UTA during the assessment answered few questions and appeared to be responding to internal stimuli and/or delusional thought content.) Patient's Education Has Been Impacted by Current Illness:  (UTA during the assessment answered few questions and appeared to be responding to internal stimuli and/or delusional thought content.)   CCA Family/Childhood History Family and Relationship History: Family history Marital status: Single Does patient have children?: No  Childhood History:  Childhood History By whom was/is the patient raised?: Other (Comment) (UTA during the assessment answered few questions and appeared to be responding to internal stimuli and/or delusional thought content.) Did patient suffer any verbal/emotional/physical/sexual abuse as a child?: No Did patient suffer from severe childhood neglect?: No Has patient ever been sexually abused/assaulted/raped as an adolescent or adult?: No Was the patient ever a victim of a crime or a disaster?: No Witnessed domestic violence?: No Has patient been affected by domestic violence as an adult?: No   CCA Substance Use Alcohol/Drug Use: Alcohol / Drug Use Pain Medications: See MAR Prescriptions: See MAR Over the Counter: See MAR History of alcohol / drug use?: No history of alcohol / drug abuse Longest period of sobriety (when/how long): Pt denies, substance use. Negative Consequences of Use:  (Pt denies, substance use.) Withdrawal Symptoms: None    ASAM's:  Six Dimensions of Multidimensional Assessment  Dimension 1:  Acute Intoxication and/or Withdrawal Potential:   Dimension 1:  Description of individual's past and current experiences of substance use and withdrawal: Pt denies, substance use.  Dimension 2:  Biomedical Conditions and Complications:   Dimension 2:  Description of patient's biomedical conditions and  complications: Pt denies, substance use.   Dimension 3:  Emotional, Behavioral, or Cognitive Conditions and Complications:  Dimension 3:  Description of emotional, behavioral, or cognitive conditions and complications: Pt denies, substance use.  Dimension 4:  Readiness to Change:  Dimension 4:  Description of Readiness to Change criteria: Pt denies, substance use.  Dimension 5:  Relapse, Continued use, or Continued Problem Potential:  Dimension 5:  Relapse, continued use, or continued problem potential critiera description: Pt denies, substance use.  Dimension 6:  Recovery/Living Environment:  Dimension 6:  Recovery/Iiving environment criteria description: Pt denies, substance use.  ASAM Severity Score:    ASAM Recommended Level of Treatment: ASAM Recommended Level of Treatment:  (Pt denies, substance use.)   Substance use Disorder (SUD) Substance Use Disorder (SUD)  Checklist Symptoms  of Substance Use:  (Pt denies, substance use.)  Recommendations for Services/Supports/Treatments: Recommendations for Services/Supports/Treatments Recommendations For Services/Supports/Treatments: Inpatient Hospitalization  Discharge Disposition: Discharge Disposition Medical Exam completed: Yes  DSM5 Diagnoses: Patient Active Problem List   Diagnosis Date Noted   Schizoaffective disorder (HCC) 06/26/2022   MDD (major depressive disorder), recurrent, severe, with psychosis (HCC) 06/24/2022   Self-injurious behavior 12/09/2017   Major depressive disorder, recurrent, severe with psychotic features (HCC) 12/08/2017     Referrals to Alternative Service(s): Referred to Alternative Service(s):   Place:   Date:   Time:    Referred to Alternative Service(s):   Place:   Date:   Time:    Referred to Alternative Service(s):   Place:   Date:   Time:    Referred to Alternative Service(s):   Place:   Date:   Time:     Redmond Pulling, [facility] Comprehensive Clinical Assessment (CCA) Screening, Triage and Referral Note  04/13/2023 Krithik Mapel [id number]  Chief Complaint:  Chief Complaint  Patient presents with   Psychiatric Evaluation   Visit Diagnosis:   Patient Reported Information How did you hear about [place]? Legal System  What Is the Reason for Your Visit/Call Today? 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 Per pt, he's Allah and born in the future. Pt reports, swallowing metal and feeling it in his feet. Pt denies, SI, HI, AVH, self-injurious behaviors and access to weapons.  How Long Has This Been Causing You Problems? <Week  What Do You Feel Would Help You the Most Today? Medication(s); Stress Management   Have You Recently Had Any Thoughts About Hurting Yourself? No  Are You Planning to Commit Suicide/Harm Yourself At This time? No   Have you Recently Had Thoughts About Hurting Someone Karolee Ohs? No  Are You Planning to Harm Someone at This Time? No  Explanation: Pt denies, HI.   Have You Used Any Alcohol or Drugs in the Past 24 Hours? No (Pt denies.)  How Long Ago Did You Use Drugs or Alcohol? Pt denies, substance use. What Did You Use and How Much? Pt denies, substance use.   Do You Currently Have a Therapist/Psychiatrist? No  Name of Therapist/Psychiatrist: Pt denies, being linked to outpatient resources.   Have You Been Recently Discharged From Any Office Practice or Programs? -- (UTA during the assessment answered few questions and appeared to be responding to internal stimuli and/or delusional thought content.)  Explanation of Discharge From Practice/Program: UTA during the assessment answered few questions and appeared to be responding to internal stimuli and/or delusional thought content.    CCA Screening Triage Referral Assessment Type of Contact: Tele-Assessment  Telemedicine Service Delivery: Telemedicine service delivery: This service was provided via telemedicine using a 2-way, interactive audio and video technology  Is this  Initial or Reassessment? Is this Initial or Reassessment?: Initial Assessment  Date Telepsych consult ordered in [facility]:  Date Telepsych consult ordered in [facility]: 04/13/23  Time Telepsych consult ordered in [facility]:  Time Telepsych consult ordered in [facility]: 0457  Location of Assessment: [facility]  Provider Location: [facility] Assessment Services    Collateral Involvement: None.   Does Patient Have a Automotive engineer Guardian? Pt is his own guardian. Name and Contact of Legal Guardian: Pt is his own guardian. If Minor and Not Living with Parent(s), Who has Custody? Pt is an adult and his own guardian.  Is CPS involved or ever been  involved? -- (UTA during the assessment answered few questions and appeared to be responding to internal stimuli and/or delusional thought content.)  Is APS involved or ever been involved? -- (UTA during the assessment answered few questions and appeared to be responding to internal stimuli and/or delusional thought content.)   Patient Determined To Be At Risk for Harm To Self or Others Based on Review of Patient Reported Information or Presenting Complaint? No  Method: No Plan  Availability of Means: No access or NA  Intent: Vague intent or NA  Notification Required: No need or identified person  Additional Information for Danger to Others Potential: -- (Pt denies, HI.)  Additional Comments for Danger to Others Potential: Pt denies, HI.  Are There Guns or Other Weapons in Your Home? -- (Pt denies, access to weapons.)  Types of Guns/Weapons: Pt denies, access to weapons.  Are These Weapons Safely Secured?                            -- (Pt denies, access to weapons.)  Who Could Verify You Are Able To Have These Secured: Pt denies, access to weapons.  Do You Have any Outstanding Charges, Pending Court Dates, Parole/Probation? Pt denies, legal involvement.  Contacted To Inform of Risk of Harm To Self or Others: Other: Comment (None.)   Does Patient Present  under Involuntary Commitment? No    [place] of Residence: [place]   Patient Currently Receiving the Following Services: Not Receiving Services   Determination of Need: Emergent (2 hours)   Options For Referral: Inpatient Hospitalization   Discharge Disposition:  Discharge Disposition Medical Exam completed: Yes  Redmond Pulling, [facility]     Redmond Pulling, MS, [facility], [facility] Triage Specialist [phone]

## 2023-04-13 NOTE — BH Assessment (Signed)
Clinician messaged Geofrey D. Borreros, RN: "Hey. It's Trey with TTS. Is the pt able to engage in the assessment, if so the pt will need to be placed in a private room. Is the pt under IVC? Also is the pt medically cleared?"    Clinician awaiting response.    Redmond Pulling, MS, [facility] Triage Specialist [phone]

## 2023-04-13 NOTE — ED Notes (Signed)
Pt belonging placed in locker #34

## 2023-04-13 NOTE — ED Triage Notes (Signed)
Pt presents to ED lobby shirtless and appearing disoriented with altered thought process. Pt reports having ticks and snaps in his feet with some discomfort. Shoes are noted on the wrong feet. Pt not able to create cohesive thought process to describe s/s.

## 2023-04-13 NOTE — Consult Note (Signed)
Patient was very confused and disorganized when he came in and was standing in front of the Nursing station talking to staff.  Most of what he was saying did not make sense.  Olanzapine and Ativan was given.  Patient has been sleeping and another attempt will be made for Psych evaluation.

## 2023-04-14 DIAGNOSIS — F22 Delusional disorders: Secondary | ICD-10-CM | POA: Diagnosis not present

## 2023-04-14 LAB — SARS CORONAVIRUS 2 BY RT PCR: SARS Coronavirus 2 by RT PCR: NEGATIVE

## 2023-04-14 MED ORDER — RISPERIDONE 2 MG PO TABS
4.0000 mg | ORAL_TABLET | Freq: Every day | ORAL | Status: DC
  Administered 2023-04-14: 4 mg via ORAL
  Filled 2023-04-14: qty 2

## 2023-04-14 MED ORDER — OLANZAPINE 5 MG PO TBDP: 5.0000 mg | ORAL_TABLET | Freq: Once | ORAL | Status: DC

## 2023-04-14 MED ORDER — BENZTROPINE MESYLATE 1 MG PO TABS
1.0000 mg | ORAL_TABLET | Freq: Every day | ORAL | Status: DC
  Administered 2023-04-14: 1 mg via ORAL
  Filled 2023-04-14: qty 1

## 2023-04-14 MED ORDER — LORAZEPAM 1 MG PO TABS
1.0000 mg | ORAL_TABLET | Freq: Three times a day (TID) | ORAL | Status: DC | PRN
  Administered 2023-04-14 – 2023-04-15 (×3): 1 mg via ORAL
  Filled 2023-04-14 (×3): qty 1

## 2023-04-14 NOTE — ED Notes (Signed)
Pt has been speaking loudly and walking into other patient rooms. Pt has been asked several times  by nurse and nurse tech, to lower his  voice and stay in room. Pt then approached pt A.D, at the nurses station and began speaking to him. Pt A.D, asked pt to please back away from him pt did not listen. Security was called. Pt AD has been moved to another unit. Pt currently in same room.

## 2023-04-14 NOTE — ED Notes (Signed)
Pt asleep in bed. Rise and fall of chest observed and movement noted.

## 2023-04-14 NOTE — ED Notes (Signed)
Pt at the nurses station talking to staff.

## 2023-04-14 NOTE — Progress Notes (Signed)
LCSW Progress Note  [id number]   Alec Williams  04/14/2023  3:00 PM  Description:   Inpatient Psychiatric Referral  Patient was recommended inpatient per Dahlia Byes, NP. There are no available beds at [facility], per [facility] Rona Ravens, RN. Patient was referred to the following out of network facilities:   [facility] Provider Address Phone Fax  [facility]  [address], [place] [address] [phone]  [facility]  [address] [place], [place] [address] [phone]  [facility] [address] [place]  [address] [place] [address] [phone]  [facility]  [address] [place], [place] [address] [phone]  [facility]  [address] [place] [address] [phone]  [facility]  [address] [place] [address] [phone]  [facility]  [address] [place] [place] [address] [phone]  [facility]  [address] [place], [place] [address] [phone]  [facility]  [address] [place] [address] [phone]  [facility]  [address] [place] [address] [phone]  [facility]  [address], [place] [address] [phone]  [facility]  [address], [place] [address] [phone]  [facility]  [address] [place] [address] [phone]  [facility]  [address] [place] [address] [phone]  [facility]  [address] [place] [address] [phone]  [facility]   [address] [place] [address] [phone]  [facility]  [address] [place] [address] [phone]  [facility]  [address], [place] [address] [phone]  [facility]  [address] [place], [place] [address] [phone]  [facility]  [address] [place] [address] [phone]  [facility]  [address] [place] [address] [phone]  [facility]  [address] [place] [address] [phone]  [facility]  [address] [place] [address] [phone]  [facility]  [address] [place] [address] [phone]  [facility]  [address], [place] [address] [phone]    Situation ongoing, CSW to continue following and update chart as more information becomes available.      Cathie Beams, LCSW  04/14/2023 3:00 PM

## 2023-04-14 NOTE — ED Notes (Signed)
Pt states he vomits in his room. He states he felt like the nurse gave him cocaine so he vomited.

## 2023-04-14 NOTE — Progress Notes (Signed)
Pt has been accepted to [facility] TODAY 04/14/2023, pending negative covid faxed to [phone]. Bed assignment: 3 East  Pt meets inpatient criteria per Dahlia Byes, NP  Attending Physician will be Shanon Payor, MD  Report can be called to: [phone]  Pt can arrive anytime today; bed is ready now  Care Team Notified: Dahlia Byes, NP and Rolm Bookbinder, RN  [place], LCSW  04/14/2023 3:17 PM

## 2023-04-14 NOTE — ED Provider Notes (Signed)
Emergency Medicine Observation Re-evaluation Note  Alec Williams is a 22 y.o. male, seen on rounds today.  Pt initially presented to the ED for complaints of Psychiatric Evaluation and foot complaint Currently, the patient is resting quietly.  Physical Exam  BP (!) 101/57 (BP Location: Left Arm)   Pulse 68   Temp 98.1 F (36.7 C) (Oral)   Resp 18   SpO2 100%  Physical Exam General: No acute distress Cardiac: Well-perfused Lungs: Nonlabored Psych: Calm  ED Course / MDM  EKG:   I have reviewed the labs performed to date as well as medications administered while in observation.  Recent changes in the last 24 hours include psychiatry evaluation.  Plan  Current plan is for inpatient psychiatric care.    Terrilee Files, MD 04/14/23 [phone]

## 2023-04-14 NOTE — ED Notes (Signed)
Pt sent to room due to repetitive conversations.

## 2023-04-14 NOTE — ED Notes (Signed)
Spoke with Sheriffs department who let this nurse know that they had a transportation to do ahead of this patient and [facility] doesn't accept patients after 9pm. The will be unable to take pt to [employer]. Will call back with time to transport in the am. New voice mail left so patient can be placed on transportation list.

## 2023-04-14 NOTE — ED Notes (Signed)
Breakfast tray given to pt 

## 2023-04-14 NOTE — ED Notes (Signed)
Called and spoke with admission at [facility] and made them aware that this patient would be coming tomorrow due to transportation issues . They will be expecting the patient tomorrow.

## 2023-04-14 NOTE — ED Notes (Signed)
Pt standing in room against the bed having a conversation with himself.

## 2023-04-14 NOTE — ED Notes (Signed)
Pt standing at nurses station window.

## 2023-04-14 NOTE — ED Notes (Signed)
Pt was asking for shoes. Nurse informed pt that he cannot have shoes. Pt went to his room.

## 2023-04-14 NOTE — ED Notes (Signed)
Pt is eating breakfast. No inappropriate behaviors observed.

## 2023-04-14 NOTE — Progress Notes (Cosign Needed Addendum)
[facility] ED Progress Note  04/14/2023 10:33 AM Alec Williams  MRN:  [id number]   Subjective:  Alec Williams is a 22 y.o. male patient admitted with hx of MDD, Cannabis use disorder, Schizoaffective disorder.came to the ER this morning wearing no shirt and appeared disoriented.  Shoes were on the wrong foot and he was not able to state what was going on with him.  Patient was brought to the ER earlier last night by [employer] whom he called to bring him to the ER.  After a while he asked to be discharged and was discharge. Patient was seen this morning and he remains disorganized, paranoid and restless.  Patient constantly comes to the Nursing station wanting to be discharged.  He is now under IVC while we look for bed for inpatient Mental health hospitalization.  Patient came to provider now and reported that he has some metals in his right eye and his stomach is full of metals as well.  Patient is holding his right eye squinting. Strategic intervention and spoke to Casimiro Needle his Engineer, civil (consulting).  Casimiro Needle states he has not received his UZEDY Inject since June.  Last he received the injection was May this year.  Patient continues to meet inpatient Psychiatry hospitalization.  We have resumed home Medications and will fax records out to facilities with available beds. Principal Problem: Paranoia (psychosis) (HCC) Diagnosis:  Principal Problem:   Paranoia (psychosis) (HCC) Active Problems:   Schizoaffective disorder (HCC)   Cannabis use disorder   ED Assessment Time Calculation: Start Time: 1003 Stop Time: 1031 Total Time in Minutes (Assessment Completion): 28   Past Psychiatric History: see initial psychiatry evaluation note  [place] Scale:  Flowsheet Row ED [facility]20[facility] in [facility] Department at [facility] Most recent reading at 04/13/2023  9:19 AM ED from 04/13/2023 in [facility] Emergency Department at [facility] Most recent reading at 04/13/2023  4:28 AM ED from  02/26/2023 in [facility] Most recent reading at 02/26/2023  8:44 PM  C-SSRS RISK CATEGORY No Risk No Risk No Risk       Past Medical History:  Past Medical History:  Diagnosis Date   Schizophrenia (HCC)    No past surgical history on file. Family History: No family history on file. Family Psychiatric  History: see initial psychiatry evaluation note Social History:  Social History   Substance and Sexual Activity  Alcohol Use No     Social History   Substance and Sexual Activity  Drug Use No    Social History   Socioeconomic History   Marital status: Single    Spouse name: Not on file   Number of children: Not on file   Years of education: Not on file   Highest education level: Not on file  Occupational History   Not on file  Tobacco Use   Smoking status: Former   Smokeless tobacco: Former   Tobacco comments:    "I smoke black and mild"  Vaping Use   Vaping status: Every Day  Substance and Sexual Activity   Alcohol use: No   Drug use: No   Sexual activity: Not on file  Other Topics Concern   Not on file  Social History Narrative   Not on file   Social Determinants of Health   Financial Resource Strain: Not on file  Food Insecurity: No Food Insecurity (06/24/2022)   Hunger Vital Sign    Worried About Running Out of Food in the Last  Year: Never true    Ran Out of Food in the Last Year: Never true  Transportation Needs: No Transportation Needs (06/24/2022)   PRAPARE - Administrator, Civil Service (Medical): No    Lack of Transportation (Non-Medical): No  Physical Activity: Not on file  Stress: Not on file  Social Connections: Not on file    Sleep: Fair  Appetite:  Fair  Current Medications: Current Facility-Administered Medications  Medication Dose Route Frequency Provider Last Rate Last Admin   benztropine (COGENTIN) tablet 0.5 mg  0.5 mg Oral QHS Obie Kallenbach C, NP   0.5 mg at 04/13/23 2118    divalproex (DEPAKOTE ER) 24 hr tablet 1,000 mg  1,000 mg Oral QHS Darryn Kydd C, NP   1,000 mg at 04/13/23 2118   guanFACINE (TENEX) tablet 1 mg  1 mg Oral QHS Suleyma Wafer C, NP   1 mg at 04/13/23 2123   risperiDONE (RISPERDAL) tablet 2 mg  2 mg Oral QHS Rily Nickey C, NP   2 mg at 04/13/23 2118   traZODone (DESYREL) tablet 50 mg  50 mg Oral QHS PRN Dahlia Byes C, NP   50 mg at 04/13/23 2118   Current Outpatient Medications  Medication Sig Dispense Refill   benztropine (COGENTIN) 0.5 MG tablet Take 1 tablet (0.5 mg total) by mouth 2 (two) times daily. 60 tablet 0   divalproex (DEPAKOTE ER) 500 MG 24 hr tablet Take 3 tablets (1,500 mg total) by mouth at bedtime. 90 tablet 0   guanFACINE (TENEX) 1 MG tablet Take 1 tablet (1 mg total) by mouth at bedtime. 30 tablet 0   mineral oil-hydrophilic petrolatum (AQUAPHOR) ointment Apply topically daily at 6 (six) AM. 420 g 0   nicotine polacrilex (NICORETTE) 2 MG gum Take 1 each (2 mg total) by mouth as needed for smoking cessation. 100 tablet 0   risperiDONE (RISPERDAL) 1 MG tablet Take 1 tablet (1 mg total) by mouth as directed for 23 doses. For days 1-3, take 2 tablets in the morning and 2 tablets in the evening. For days 4-6, take 1 tablet in the morning and 1 tablet in the evening.  For days 7-9, take 1 tablet in the evening. For days 10-14, take 0.5 tablet in the evening. Then stop. 23 tablet 0   risperiDONE ER (UZEDY) 100 MG/0.28ML SUSY Inject 100 mg into the skin once for 1 dose. Next administration is due on 07-26-2022. 0.28 mL 0   Scar Treatment Products (MEDERMA) GEL Apply 1 Application topically daily.  0   traZODone (DESYREL) 50 MG tablet Take 1 tablet (50 mg total) by mouth at bedtime as needed for sleep. 30 tablet 0    Lab Results:  Results for orders placed or performed during the hospital encounter of 04/13/23 (from the past 48 hour(s))  Comprehensive metabolic panel     Status: None   Collection Time: 04/13/23   9:33 AM  Result Value Ref Range   Sodium 140 135 - 145 mmol/L   Potassium 4.0 3.5 - 5.1 mmol/L   Chloride 104 98 - 111 mmol/L   CO2 25 22 - 32 mmol/L   Glucose, Bld 84 70 - 99 mg/dL    Comment: Glucose reference range applies only to samples taken after fasting for at least 8 hours.   BUN 12 6 - 20 mg/dL   Creatinine, Ser 3.08 0.61 - 1.24 mg/dL   Calcium 9.9 8.9 - 65.7 mg/dL   Total Protein 8.1 6.5 -  8.1 g/dL   Albumin 5.0 3.5 - 5.0 g/dL   AST 21 15 - 41 U/L   ALT 18 0 - 44 U/L   Alkaline Phosphatase 69 38 - 126 U/L   Total Bilirubin 0.9 0.3 - 1.2 mg/dL   GFR, Estimated >40 >98 mL/min    Comment: (NOTE) Calculated using the CKD-EPI Creatinine Equation (2021)    Anion gap 11 5 - 15    Comment: Performed at [facility], [address] [place], [place] [address]  CBC with Differential/Platelet     Status: None   Collection Time: 04/13/23  9:33 AM  Result Value Ref Range   WBC 6.4 4.0 - 10.5 K/uL   RBC 4.99 4.22 - 5.81 MIL/uL   Hemoglobin 15.9 13.0 - 17.0 g/dL   HCT 78.2 95.6 - 21.3 %   MCV 93.8 80.0 - 100.0 fL   MCH 31.9 26.0 - 34.0 pg   MCHC 34.0 30.0 - 36.0 g/dL   RDW 08.6 57.8 - 46.9 %   Platelets 281 150 - 400 K/uL   nRBC 0.0 0.0 - 0.2 %   Neutrophils Relative % 69 %   Neutro Abs 4.4 1.7 - 7.7 K/uL   Lymphocytes Relative 24 %   Lymphs Abs 1.6 0.7 - 4.0 K/uL   Monocytes Relative 5 %   Monocytes Absolute 0.3 0.1 - 1.0 K/uL   Eosinophils Relative 1 %   Eosinophils Absolute 0.0 0.0 - 0.5 K/uL   Basophils Relative 1 %   Basophils Absolute 0.0 0.0 - 0.1 K/uL   Immature Granulocytes 0 %   Abs Immature Granulocytes 0.02 0.00 - 0.07 K/uL    Comment: Performed at [facility], [address], [place], [place] [address]  Ethanol     Status: None   Collection Time: 04/13/23  9:33 AM  Result Value Ref Range   Alcohol, Ethyl (B) <10 <10 mg/dL    Comment: (NOTE) Lowest detectable limit for serum alcohol is 10 mg/dL.  For medical  purposes only. Performed at [facility], [address], [place], [place] [address]   Rapid urine drug screen (hospital performed)     Status: Abnormal   Collection Time: 04/13/23 10:29 AM  Result Value Ref Range   Opiates NONE DETECTED NONE DETECTED   Cocaine NONE DETECTED NONE DETECTED   Benzodiazepines NONE DETECTED NONE DETECTED   Amphetamines NONE DETECTED NONE DETECTED   Tetrahydrocannabinol POSITIVE (A) NONE DETECTED   Barbiturates NONE DETECTED NONE DETECTED    Comment: (NOTE) DRUG SCREEN FOR MEDICAL PURPOSES ONLY.  IF CONFIRMATION IS NEEDED FOR ANY PURPOSE, NOTIFY LAB WITHIN 5 DAYS.  LOWEST DETECTABLE LIMITS FOR URINE DRUG SCREEN Drug Class                     Cutoff (ng/mL) Amphetamine and metabolites    1000 Barbiturate and metabolites    200 Benzodiazepine                 200 Opiates and metabolites        300 Cocaine and metabolites        300 THC                            50 Performed at [facility], [address], [place], [place] [address]     Blood Alcohol level:  Lab Results  Component Value Date   ETH <  10 04/13/2023   ETH <10 04/13/2023    Physical Findings:  CIWA:    COWS:     Musculoskeletal: Strength & Muscle Tone: within normal limits Gait & Station: normal Patient leans: Front  Psychiatric Specialty Exam:  Presentation  General Appearance:  Casual  Eye Contact: Good  Speech: Clear and Coherent; Normal Rate  Speech Volume: Normal  Handedness: Right   Mood and Affect  Mood: Euphoric  Affect: Congruent; Full Range   Thought Process  Thought Processes: Disorganized; Irrevelant  Descriptions of Associations:Tangential  Orientation:Partial  Thought Content:Illogical  History of Schizophrenia/Schizoaffective disorder:Yes  Duration of Psychotic Symptoms:Greater than six months  Hallucinations:Hallucinations: None  Ideas of Reference:None  Suicidal Thoughts:Suicidal  Thoughts: No  Homicidal Thoughts:Homicidal Thoughts: No   Sensorium  Memory: Immediate Poor; Remote Poor; Recent Poor  Judgment: Impaired  Insight: Lacking   Executive Functions  Concentration: Fair  Attention Span: Fair  Recall: Poor  Fund of Knowledge: Poor  Language: Fair   Psychomotor Activity  Psychomotor Activity: Psychomotor Activity: Normal   Assets  Assets: Manufacturing systems engineer; Housing; Physical Health   Sleep  Sleep: Sleep: Fair    Physical Exam: Physical Exam Vitals and nursing note reviewed.  Constitutional:      Appearance: Normal appearance.  HENT:     Head: Normocephalic.     Nose: Nose normal.  Cardiovascular:     Rate and Rhythm: Normal rate.  Pulmonary:     Effort: Pulmonary effort is normal.  Musculoskeletal:        General: Normal range of motion.     Cervical back: Normal range of motion.  Skin:    General: Skin is dry.  Neurological:     Mental Status: He is alert and oriented to person, place, and time.  Psychiatric:        Attention and Perception: He is inattentive.        Mood and Affect: Mood is anxious and elated.        Speech: Speech normal.        Behavior: Behavior is hyperactive. Behavior is cooperative.        Thought Content: Thought content is paranoid and delusional.        Cognition and Memory: Cognition is impaired.        Judgment: Judgment is impulsive and inappropriate.    Review of Systems  Constitutional: Negative.   HENT: Negative.    Eyes: Negative.   Respiratory: Negative.    Cardiovascular: Negative.   Gastrointestinal: Negative.   Genitourinary: Negative.   Musculoskeletal: Negative.   Skin: Negative.   Neurological: Negative.   Endo/Heme/Allergies: Negative.   Psychiatric/Behavioral:  Positive for substance abuse. The patient is nervous/anxious.        Paranoid   Blood pressure 124/86, pulse 63, temperature 98.5 F (36.9 C), temperature source Oral, resp. rate 18, SpO2  100%. There is no height or weight on file to calculate BMI.  Medical Decision Making: Patient continues to meet criteria for inpatient Psychiatry hospitalization.  We will continue to offer Medications while seeking bed placement. Disposition:  admit, seek bed placement.  Earney Navy, NP-PMHNP-BC 04/14/2023, 10:33 AM

## 2023-04-14 NOTE — ED Notes (Signed)
Pt is pacing the halls. No inappropriate behavior observed.

## 2023-04-15 NOTE — ED Notes (Signed)
Pt is asking to see his provider. RN made aware of this request.

## 2023-04-15 NOTE — ED Notes (Signed)
[facility] Department here for pt transport.

## 2023-04-15 NOTE — ED Notes (Signed)
IVC paperwork has been corrected, Sheriff notified for transport- stated it may be this evening or tomorrow before they can come.

## 2023-04-15 NOTE — ED Notes (Signed)
Breakfast Tray delivered

## 2023-04-15 NOTE — ED Notes (Signed)
Pt changed into blue paper scrubs due to the [employer] department coming for pt transport to different facility
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
## 2023-04-15 NOTE — ED Notes (Signed)
Pt lunch tray delivered.

## 2023-04-15 NOTE — ED Notes (Signed)
Called patient mother to let her know pt will be transferred to [facility], sheriff to be here in approx 30 minutes for transport

## 2023-04-15 NOTE — ED Provider Notes (Signed)
Emergency Medicine Observation Re-evaluation Note  Alec Williams is a 22 y.o. male, seen on rounds today.  Pt initially presented to the ED for complaints of Psychiatric Evaluation and foot complaint Currently, the patient is walking around.  Physical Exam  BP (!) 109/52 (BP Location: Right Arm)   Pulse 76   Temp 98.2 F (36.8 C)   Resp 15   SpO2 100%  Physical Exam General: No acute distress Cardiac: Normal rate Lungs: No increased work of breathing Psych: Calm  ED Course / MDM  EKG:   I have reviewed the labs performed to date as well as medications administered while in observation.  Recent changes in the last 24 hours include none.  Plan  Current plan is for placement.  Was going to be transported today but paperwork was incorrect.  Had to be redone.Rolan Bucco, MD 04/15/23 [phone]
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
## 2023-05-15 ENCOUNTER — Encounter (HOSPITAL_COMMUNITY): Payer: Self-pay

## 2023-05-15 ENCOUNTER — Emergency Department (HOSPITAL_COMMUNITY)
Admission: EM | Admit: 2023-05-15 | Discharge: 2023-05-16 | Disposition: A | Discharge status: Other Acute Inpatient Hospital | Payer: MEDICAID | Attending: Emergency Medicine | Admitting: Emergency Medicine

## 2023-05-15 ENCOUNTER — Other Ambulatory Visit: Payer: Self-pay

## 2023-05-15 DIAGNOSIS — F29 Unspecified psychosis not due to a substance or known physiological condition: Secondary | ICD-10-CM | POA: Diagnosis present

## 2023-05-15 DIAGNOSIS — F259 Schizoaffective disorder, unspecified: Secondary | ICD-10-CM | POA: Diagnosis present

## 2023-05-15 DIAGNOSIS — F840 Autistic disorder: Secondary | ICD-10-CM | POA: Insufficient documentation

## 2023-05-15 DIAGNOSIS — R45850 Homicidal ideations: Secondary | ICD-10-CM | POA: Insufficient documentation

## 2023-05-15 DIAGNOSIS — R45851 Suicidal ideations: Secondary | ICD-10-CM | POA: Insufficient documentation

## 2023-05-15 DIAGNOSIS — Z79899 Other long term (current) drug therapy: Secondary | ICD-10-CM | POA: Diagnosis not present

## 2023-05-15 LAB — CBC
HCT: 40.7 % (ref 39.0–52.0)
Hemoglobin: 14 g/dL (ref 13.0–17.0)
MCH: 32.3 pg (ref 26.0–34.0)
MCHC: 34.4 g/dL (ref 30.0–36.0)
MCV: 94 fL (ref 80.0–100.0)
Platelets: 232 10*3/uL (ref 150–400)
RBC: 4.33 MIL/uL (ref 4.22–5.81)
RDW: 12.1 % (ref 11.5–15.5)
WBC: 12.8 10*3/uL — ABNORMAL HIGH (ref 4.0–10.5)
nRBC: 0 % (ref 0.0–0.2)

## 2023-05-15 LAB — TROPONIN I (HIGH SENSITIVITY): Troponin I (High Sensitivity): <2 ng/L (ref <18)

## 2023-05-15 LAB — COMPREHENSIVE METABOLIC PANEL
ALT: 21 U/L (ref 0–44)
AST: 19 U/L (ref 15–41)
Albumin: 4.6 g/dL (ref 3.5–5.0)
Alkaline Phosphatase: 65 U/L (ref 38–126)
Anion gap: 8 (ref 5–15)
BUN: 11 mg/dL (ref 6–20)
CO2: 27 mmol/L (ref 22–32)
Calcium: 10.2 mg/dL (ref 8.9–10.3)
Chloride: 101 mmol/L (ref 98–111)
Creatinine, Ser: 0.99 mg/dL (ref 0.61–1.24)
GFR, Estimated: >60 mL/min (ref >60)
Glucose, Bld: 86 mg/dL (ref 70–99)
Potassium: 3.7 mmol/L (ref 3.5–5.1)
Sodium: 136 mmol/L (ref 135–145)
Total Bilirubin: 1.7 mg/dL — ABNORMAL HIGH (ref 0.3–1.2)
Total Protein: 7.2 g/dL (ref 6.5–8.1)

## 2023-05-15 LAB — ACETAMINOPHEN LEVEL: Acetaminophen (Tylenol), Serum: <10 ug/mL — ABNORMAL LOW (ref 10–30)

## 2023-05-15 LAB — ETHANOL: Alcohol, Ethyl (B): <10 mg/dL (ref <10)

## 2023-05-15 LAB — SALICYLATE LEVEL: Salicylate Lvl: <7 mg/dL — ABNORMAL LOW (ref 7.0–30.0)

## 2023-05-15 MED ORDER — HALOPERIDOL LACTATE 5 MG/ML IJ SOLN: 5.0000 mg | Freq: Once | INTRAMUSCULAR | Status: DC

## 2023-05-15 MED ORDER — MIDAZOLAM HCL 2 MG/2ML IJ SOLN: 5.0000 mg | Freq: Once | INTRAMUSCULAR | Status: DC

## 2023-05-15 MED ORDER — DIPHENHYDRAMINE HCL 50 MG/ML IJ SOLN: 50.0000 mg | Freq: Once | INTRAMUSCULAR | Status: DC

## 2023-05-15 NOTE — BH Assessment (Addendum)
@  2238, The consult has been deferred to [facility], and the [facility] Coordinator Leotis Shames) will coordinate the services accordingly. The patient's care team and [facility] have been informed. The [facility] coordinator will also provide updates on the timing of the patient's evaluation.

## 2023-05-15 NOTE — ED Provider Notes (Signed)
[name] Provider Note   CSN: [id number] Arrival date & time: 05/15/23  1738     History  Chief Complaint  Patient presents with   IVC    Alec Williams is a 22 y.o. male.  22 year old male with history of schizophrenia and reported autism who presents to the emergency department for suicidal and homicidal ideations.  Patient presents as IVC in police custody.  Was reporting to cut himself and reports that he was threatening to kill himself.  Also history of threatening to kill his neighbors children.  Did attempt to break into their house yesterday. Denies any pain.        Home Medications Prior to Admission medications   Medication Sig Start Date End Date Taking? Authorizing Provider  risperidone (RISPERDAL) 4 MG tablet Take 4 mg by mouth at bedtime.    [provider]  risperiDONE ER (UZEDY) 250 MG/0.7ML SUSY Inject 250 mg into the skin See admin instructions. Every 8 weeks    [provider]  traZODone (DESYREL) 50 MG tablet Take 50 mg by mouth at bedtime.    [provider]      Allergies    Patient has no known allergies.    Review of Systems   Review of Systems  Physical Exam Updated Vital Signs BP (!) 106/51 (BP Location: Left Arm)   Pulse 62   Temp 97.9 F (36.6 C) (Oral)   Resp 18   Ht 5\' 4"  (1.626 m)   Wt 61.2 kg   SpO2 100%   BMI 23.17 kg/m  Physical Exam Vitals and nursing note reviewed.  Constitutional:      General: He is not in acute distress.    Appearance: He is well-developed.     Comments: Alert and oriented x 3.  Conversant.  Tangential thought.  HENT:     Head: Normocephalic and atraumatic.     Right Ear: External ear normal.     Left Ear: External ear normal.     Nose: Nose normal.  Eyes:     Extraocular Movements: Extraocular movements intact.     Conjunctiva/sclera: Conjunctivae normal.     Pupils: Pupils are equal, round, and reactive to light.   Cardiovascular:     Rate and Rhythm: Normal rate and regular rhythm.     Heart sounds: Normal heart sounds.  Pulmonary:     Effort: Pulmonary effort is normal. No respiratory distress.     Breath sounds: Normal breath sounds.  Musculoskeletal:     Cervical back: Normal range of motion and neck supple.     Comments: Old healing cuts on right hand and right shoulder.  No other cuts noted to arms.  Skin:    General: Skin is warm and dry.  Neurological:     Mental Status: He is alert. Mental status is at baseline.  Psychiatric:        Mood and Affect: Mood normal.        Behavior: Behavior normal.     ED Results / Procedures / Treatments   Labs (all labs ordered are listed, but only abnormal results are displayed) Labs Reviewed  COMPREHENSIVE METABOLIC PANEL - Abnormal; Notable for the following components:      Result Value   Total Bilirubin 1.7 (*)    All other components within normal limits  SALICYLATE LEVEL - Abnormal; Notable for the following components:   Salicylate Lvl <7.0 (*)    All other components within  normal limits  ACETAMINOPHEN LEVEL - Abnormal; Notable for the following components:   Acetaminophen (Tylenol), Serum <10 (*)    All other components within normal limits  CBC - Abnormal; Notable for the following components:   WBC 12.8 (*)    All other components within normal limits  ETHANOL  RAPID URINE DRUG SCREEN, HOSP PERFORMED  TROPONIN I (HIGH SENSITIVITY)    EKG EKG Interpretation Date/Time:  Thursday May 15 2023 19:19:02 EDT Ventricular Rate:  71 PR Interval:  140 QRS Duration:  84 QT Interval:  384 QTC Calculation: 417 R Axis:   79  Text Interpretation:  Normal sinus rhythm with sinus arrhythmia ST elevation consider lateral injury or acute infarct ST elevation in the lateral leads Possible benign early repolarization vs ischemia Confirmed by Vonita Moss [id number]) on 05/16/2023 12:10:38 AM  Radiology No results  found.  Procedures Procedures    Medications Ordered in ED Medications  midazolam (VERSED) injection 5 mg (has no administration in time range)  haloperidol lactate (HALDOL) injection 5 mg (has no administration in time range)  diphenhydrAMINE (BENADRYL) injection 50 mg (has no administration in time range)  divalproex (DEPAKOTE ER) 24 hr tablet 500 mg (has no administration in time range)  clonazePAM (KLONOPIN) tablet 1 mg (has no administration in time range)  guanFACINE (TENEX) tablet 1 mg (has no administration in time range)  risperiDONE (RISPERDAL) tablet 6 mg (has no administration in time range)  benztropine (COGENTIN) tablet 0.5 mg (has no administration in time range)    ED Course/ Medical Decision Making/ A&P Clinical Course as of 05/16/23 1145  Thu May 15, 2023  2140 Attempted to flee. Ordering medications. [RP]    Clinical Course User Index [RP] Rondel Baton, MD                                 Medical Decision Making Amount and/or Complexity of Data Reviewed Labs: ordered.  Risk Prescription drug management.   Alec Williams is a 22 y.o. male with comorbidities that complicate the patient evaluation including schizophrenia and autism who presents to the emergency department with suicidal and homicidal ideations also with bizarre behavior and affect on exam  Initial Ddx:  Psychosis, suicidal ideations, homicidal ideations, cutting, substance abuse  MDM/Course:  Patient presents to the emergency department with apparent threats on his own life and his neighbors.  Does appear to potentially be psychotic at this time.  IVC paperwork filled out.  Had psychiatric clearing labs that were unremarkable.  Did have an EKG which did show some ST elevations which I suspect are from benign early repolarization especially since patient is not complaining of any chest discomfort or shortness of breath or any other anginal equivalents.  Out of an abundance of caution  and troponin was sent which was undetectably low.  Upon re-evaluation patient remained psychotic.  Was transferred to the emergency department psychiatric unit and attempted to flee the emergency department so he required intramuscular medication to help calm him down.  TTS consult placed for the patient.  This patient presents to the ED for concern of complaints listed in HPI, this involves an extensive number of treatment options, and is a complaint that carries with it a high risk of complications and morbidity. Disposition including potential need for admission considered.   Dispo: Psych border  Additional history obtained from police Records reviewed Outpatient Clinic Notes The following labs were independently interpreted: Chemistry  and show no acute abnormality I personally reviewed and interpreted the pt's EKG: see above for interpretation  I have reviewed the patients home medications and made adjustments as needed Consults: TTS  CRITICAL CARE Performed by: Rondel Baton   Total critical care time: 30 minutes  Critical care time was exclusive of separately billable procedures and treating other patients.  Critical care was necessary to treat or prevent imminent or life-threatening deterioration.  Critical care was time spent personally by me on the following activities: development of treatment plan with patient and/or surrogate as well as nursing, discussions with consultants, evaluation of patient's response to treatment, examination of patient, obtaining history from patient or surrogate, ordering and performing treatments and interventions, ordering and review of laboratory studies, ordering and review of radiographic studies, pulse oximetry and re-evaluation of patient's condition.   Final Clinical Impression(s) / ED Diagnoses Final diagnoses:  Psychosis, unspecified psychosis type (HCC)  Homicidal ideation  Suicidal ideation    Rx / DC Orders ED Discharge Orders      None         Rondel Baton, MD 05/16/23 1145

## 2023-05-15 NOTE — ED Notes (Signed)
Patient made an attempt to flee out the rear entrance to purple. Patient was stopped by security and dragged back to room where he began yelling and screaming about his mom and taxes. Provider notified and meds requested pending orders

## 2023-05-15 NOTE — ED Notes (Signed)
Pt bit off identification wristband, nurse was notified.

## 2023-05-15 NOTE — ED Notes (Signed)
Pt is dressed out in purple scrubs and all personal items are in locker 3

## 2023-05-15 NOTE — ED Triage Notes (Signed)
Pt presents to the ER via Police in Handcuffs for IVC.

## 2023-05-16 LAB — RAPID URINE DRUG SCREEN, HOSP PERFORMED
Amphetamines: NOT DETECTED
Barbiturates: NOT DETECTED
Benzodiazepines: NOT DETECTED
Cocaine: NOT DETECTED
Opiates: NOT DETECTED
Tetrahydrocannabinol: NOT DETECTED

## 2023-05-16 MED ORDER — DIVALPROEX SODIUM ER 500 MG PO TB24
500.0000 mg | ORAL_TABLET | Freq: Two times a day (BID) | ORAL | Status: DC
  Administered 2023-05-16: 500 mg via ORAL
  Filled 2023-05-16 (×2): qty 1

## 2023-05-16 MED ORDER — BENZTROPINE MESYLATE 1 MG PO TABS
0.5000 mg | ORAL_TABLET | Freq: Two times a day (BID) | ORAL | Status: DC
  Administered 2023-05-16: 0.5 mg via ORAL
  Filled 2023-05-16 (×2): qty 1

## 2023-05-16 MED ORDER — GUANFACINE HCL 1 MG PO TABS
1.0000 mg | ORAL_TABLET | Freq: Every day | ORAL | Status: DC
  Filled 2023-05-16: qty 1

## 2023-05-16 MED ORDER — RISPERIDONE 3 MG PO TABS: 6.0000 mg | ORAL_TABLET | Freq: Every day | ORAL | Status: DC

## 2023-05-16 MED ORDER — CLONAZEPAM 0.5 MG PO TABS: 1.0000 mg | ORAL_TABLET | Freq: Every day | ORAL | Status: DC

## 2023-05-16 NOTE — Consult Note (Cosign Needed Addendum)
[facility] ED ASSESSMENT   Reason for Consult:  Psych consult Referring Physician:  Vonita Moss Patient Identification: Alec Williams MRN:  [id number] ED Chief Complaint: Schizoaffective disorder [facility])  Diagnosis:  Principal Problem:   Schizoaffective disorder [facility])   ED Assessment Time Calculation: Start Time: 0700 Stop Time: 0800 Total Time in Minutes (Assessment Completion): 60  HPI:  Alec Williams is a 22 y.o. male patient brought to the [facility] ED via police in handcuffs under IVC.  He has a history of MDD, schizoaffective disorder,  paranoia, and cannabis use disorder.  Subjective:   Alec Williams is a 22 y.o. male patient brought to the [facility] ED via police in handcuffs under IVC.  He has a history of MDD, schizoaffective disorder,  paranoia, and cannabis use disorder.  Alec Williams, 22 y.o., male patient seen face to face by this provider, consulted with Dr. Lucianne Muss; and chart reviewed on 05/16/23.  On evaluation Alec Williams reports "I picked up glass and cut the cell phone line here" he is pointing to his right wrist.  When asked if the cell phone line is in his right wrist, patient confirms there is a cell phone line in his wrist and he doesn't want it there.  The interview includes a lot of nonsensical statements by the patient.  He says that he "sleeps good; I've just been up mainly. I haven't slept.  I'm not manic."  Patient endorses auditory hallucinations of a voice that says "[id number]..." repeatedly.  Patient states he finds it funny.    Patient attempted to elope and had to be redirected.  Patient states he was in jail in February on stalking charges that were dismissed.  He says he likes to work on his instagram "[id number]" when he isn't in the hospital or in jail.  Patient states he lives with his brother and he volunteers "my mom is a single mother."  Patient laughs intermittently and inappropriately throughout the interview.     During evaluation  Alec Williams is seated in bed in no acute distress.  He is alert, oriented x 2, calm, cooperative and attentive.  His mood is labile with congruent affect.  He has normal speech, and behavior.  Objectively there is evidence of psychosis/mania and delusional thinking.  Patient is not able to converse coherently; his thoughts are delusional and he seems preoccupied at times.  He denies suicidal/self-harm/homicidal ideation, psychosis, and paranoia.  Patient was unable to answer questions appropriately.    Collateral from Addison Naegeli [phone], Patient has been decompensating for several days.  He has been knocking on neighbors doors and yelling at the [place].  She says he has been talking to himself and laughing inappropriately. Mom says patient lives with her and also in the home is patient's twin brother and younger brother.  Mom says patient's ACT team is [facility] and that she is waiting for [facility] to help her get her son into a group home.  She says his outbursts are coming to often now and she is struggling to manage them.    Collateral from [facility] ACT team, They are able to verify patient's current medication regimen.  Patient is taking: Uzedy 250mg  IM Q 8 weeks - Last dose received 05/02/2023 Depakote ER 500mg  PO BID Klonopin 1mg  PO Q HS Guanfacine 1mg  PO Q HS Risperidone 6mg  PO Q HS Cogentin 0.5mg  PO BID The ACT teams takes his medications to him each week in bubble packs.  The ACT team nurse reports that they have  seen the patient open all the packs and mix the medications all together; the patient then takes them haphazardly throughout the week.  The ACT nurse is unaware of any steps being taken to apply for group homes for the patient, but strongly endorses a group home for this patient.  Per the ACT nurse, the patient needs someone actively managing his medications for him.  He stated the patient does well when taking his medications as prescribed.     Patient is acutely  psychotic and requires inpatient psychiatric hospitalization for stabilization and treatment.  Past Psychiatric History: Previous hospitalization and history of MDD, schizoaffective disorder,  paranoia, and cannabis use disorder; has an ACT team.  Risk to Self or Others: Is the patient at risk to self? No Has the patient been a risk to self in the past 6 months? No Has the patient been a risk to self within the distant past? No Is the patient a risk to others? No Has the patient been a risk to others in the past 6 months? No Has the patient been a risk to others within the distant past? No  [place] Scale:  Flowsheet Row ED from 05/15/2023 in [facility] Emergency Department at [facility] Most recent reading at 05/15/2023  6:33 PM ED from 04/13/2023 in [facility] Emergency Department at [facility] Most recent reading at 04/13/2023  9:19 AM ED from 04/13/2023 in [facility] Emergency Department at [facility] Most recent reading at 04/13/2023  4:28 AM  C-SSRS RISK CATEGORY No Risk No Risk No Risk       Substance Abuse:   HX of Cannabis Use Disorder   Past Medical History:  Past Medical History:  Diagnosis Date   Schizophrenia (HCC)    History reviewed. No pertinent surgical history. Family History: History reviewed. No pertinent family history. Family Psychiatric  History: Mom's says no, ACT team says twin brother has some mental health issues Social History:  Social History   Substance and Sexual Activity  Alcohol Use No     Social History   Substance and Sexual Activity  Drug Use No    Social History   Socioeconomic History   Marital status: Single    Spouse name: Not on file   Number of children: Not on file   Years of education: Not on file   Highest education level: Not on file  Occupational History   Not on file  Tobacco Use   Smoking status: Former   Smokeless tobacco: Former   Tobacco comments:    "I smoke black and mild"  Vaping Use    Vaping status: Every Day  Substance and Sexual Activity   Alcohol use: No   Drug use: No   Sexual activity: Not on file  Other Topics Concern   Not on file  Social History Narrative   Not on file   Social Determinants of Health   Financial Resource Strain: Not on file  Food Insecurity: No Food Insecurity (06/24/2022)   Hunger Vital Sign    Worried About Running Out of Food in the Last Year: Never true    Ran Out of Food in the Last Year: Never true  Transportation Needs: No Transportation Needs (06/24/2022)   PRAPARE - Administrator, Civil Service (Medical): No    Lack of Transportation (Non-Medical): No  Physical Activity: Not on file  Stress: Not on file  Social Connections: Not on file   Additional Social History: Lives  with Mom, twin brother and younger brother    Allergies:  No Known Allergies  Labs:  Results for orders placed or performed during the hospital encounter of 05/15/23 (from the past 48 hour(s))  Comprehensive metabolic panel     Status: Abnormal   Collection Time: 05/15/23  6:41 PM  Result Value Ref Range   Sodium 136 135 - 145 mmol/L   Potassium 3.7 3.5 - 5.1 mmol/L   Chloride 101 98 - 111 mmol/L   CO2 27 22 - 32 mmol/L   Glucose, Bld 86 70 - 99 mg/dL    Comment: Glucose reference range applies only to samples taken after fasting for at least 8 hours.   BUN 11 6 - 20 mg/dL   Creatinine, Ser 4.09 0.61 - 1.24 mg/dL   Calcium 81.1 8.9 - 91.4 mg/dL   Total Protein 7.2 6.5 - 8.1 g/dL   Albumin 4.6 3.5 - 5.0 g/dL   AST 19 15 - 41 U/L   ALT 21 0 - 44 U/L   Alkaline Phosphatase 65 38 - 126 U/L   Total Bilirubin 1.7 (H) 0.3 - 1.2 mg/dL   GFR, Estimated >78 >29 mL/min    Comment: (NOTE) Calculated using the CKD-EPI Creatinine Equation (2021)    Anion gap 8 5 - 15    Comment: Performed at [facility], [address], [place], [place] [address]  Ethanol     Status: None   Collection Time: 05/15/23  6:41 PM  Result Value Ref Range    Alcohol, Ethyl (B) <10 <10 mg/dL    Comment: (NOTE) Lowest detectable limit for serum alcohol is 10 mg/dL.  For medical purposes only. Performed at [facility], [address] [place], [place] [address]   Salicylate level     Status: Abnormal   Collection Time: 05/15/23  6:41 PM  Result Value Ref Range   Salicylate Lvl <7.0 (L) 7.0 - 30.0 mg/dL    Comment: Performed at [facility], [address] [place], [place] [address]  Acetaminophen level     Status: Abnormal   Collection Time: 05/15/23  6:41 PM  Result Value Ref Range   Acetaminophen (Tylenol), Serum <10 (L) 10 - 30 ug/mL    Comment: (NOTE) Therapeutic concentrations vary significantly. A range of 10-30 ug/mL  may be an effective concentration for many patients. However, some  are best treated at concentrations outside of this range. Acetaminophen concentrations >150 ug/mL at 4 hours after ingestion  and >50 ug/mL at 12 hours after ingestion are often associated with  toxic reactions.  Performed at [facility], [address], [place], [place] [address]   cbc     Status: Abnormal   Collection Time: 05/15/23  6:41 PM  Result Value Ref Range   WBC 12.8 (H) 4.0 - 10.5 K/uL   RBC 4.33 4.22 - 5.81 MIL/uL   Hemoglobin 14.0 13.0 - 17.0 g/dL   HCT 41.3 24.4 - 01.0 %   MCV 94.0 80.0 - 100.0 fL   MCH 32.3 26.0 - 34.0 pg   MCHC 34.4 30.0 - 36.0 g/dL   RDW 27.2 53.6 - 64.4 %   Platelets 232 150 - 400 K/uL   nRBC 0.0 0.0 - 0.2 %    Comment: Performed at [facility], [address], [place], [place] [address]  Troponin I (High Sensitivity)     Status: None   Collection Time: 05/15/23  6:41 PM  Result  Value Ref Range   Troponin I (High Sensitivity) <2 <18 ng/L    Comment: (NOTE) Elevated high sensitivity troponin I (hsTnI) values and significant  changes across serial measurements may suggest ACS but many other  chronic and acute conditions are known to elevate hsTnI results.  Refer to the  "Links" section for chest pain algorithms and additional  guidance. Performed at [facility], [address], [place], [place] [address]     Current Facility-Administered Medications  Medication Dose Route Frequency Provider Last Rate Last Admin   diphenhydrAMINE (BENADRYL) injection 50 mg  50 mg Intramuscular Once Rondel Baton, MD       haloperidol lactate (HALDOL) injection 5 mg  5 mg Intramuscular Once Rondel Baton, MD       midazolam (VERSED) injection 5 mg  5 mg Intramuscular Once Rondel Baton, MD       Current Outpatient Medications  Medication Sig Dispense Refill   risperidone (RISPERDAL) 4 MG tablet Take 4 mg by mouth at bedtime.     risperiDONE ER (UZEDY) 250 MG/0.7ML SUSY Inject 250 mg into the skin See admin instructions. Every 8 weeks     traZODone (DESYREL) 50 MG tablet Take 50 mg by mouth at bedtime.      Musculoskeletal: Strength & Muscle Tone: within normal limits Gait & Station: normal Patient leans: N/A   Psychiatric Specialty Exam: Presentation  General Appearance:  Appropriate for Environment  Eye Contact: Good  Speech: Normal Rate  Speech Volume: Normal  Handedness: Right   Mood and Affect  Mood: Euphoric  Affect: Labile   Thought Process  Thought Processes: Disorganized  Descriptions of Associations:Loose  Orientation:Partial  Thought Content:Delusions  History of Schizophrenia/Schizoaffective disorder:Yes  Duration of Psychotic Symptoms:Greater than six months  Hallucinations:Hallucinations: Auditory Description of Auditory Hallucinations: I hear a voice saying "[id number]..."  Ideas of Reference:Delusions; Paranoia  Suicidal Thoughts:Suicidal Thoughts: No  Homicidal Thoughts:Homicidal Thoughts: No   Sensorium  Memory: Immediate Poor; Recent Poor; Remote Poor  Judgment: Impaired  Insight: Lacking   Executive Functions  Concentration: Fair  Attention  Span: Poor  Recall: Poor  Fund of Knowledge: Poor  Language: Fair   Psychomotor Activity  Psychomotor Activity: Psychomotor Activity: Normal   Assets  Assets: Housing; Leisure Time    Sleep  Sleep: Sleep: Fair Number of Hours of Sleep: 0   Physical Exam: Physical Exam Vitals and nursing note reviewed.  Eyes:     Pupils: Pupils are equal, round, and reactive to light.  Pulmonary:     Effort: Pulmonary effort is normal.  Skin:    General: Skin is dry.  Neurological:     Mental Status: He is alert. He is disoriented.    Review of Systems  Psychiatric/Behavioral:  Positive for hallucinations.   All other systems reviewed and are negative.  Blood pressure (!) 106/51, pulse 62, temperature 97.9 F (36.6 C), temperature source Oral, resp. rate 18, height 5\' 4"  (1.626 m), weight 61.2 kg, SpO2 100%. Body mass index is 23.17 kg/m.  Medical Decision Making: Patient case reviewed and discussed with Dr Lucianne Muss.  Patient is acutely psychotic and requires inpatient psychiatric hospitalization for stabilization and treatment.  Problem 1: Schizoaffective Disorder - Recommend inpatient psychiatric hospitalization -Depakote ER 500mg  PO Q BID -Klonopin 1mg  PO Q HS -Guanfacine 1mg  PO Q HS -Risperidone 6mg  Q HS -Cognetin 0.5mg  PO BID   Disposition:  Recommend inpatient psychiatric hospitalization for stabilization and treatment.  Thomes Lolling, NP 05/16/2023 8:36 AM

## 2023-05-16 NOTE — ED Notes (Signed)
BH NP at bedside speaking w/ pt at this time

## 2023-05-16 NOTE — ED Notes (Signed)
Patient did the TTS

## 2023-05-16 NOTE — ED Provider Notes (Signed)
Patient reassessed.  Has been accepted to [facility].  Sheriff's are here to transport the patient at this time.  He is calm and cooperative currently.   Rondel Baton, MD 05/16/23 [phone]

## 2023-05-16 NOTE — ED Notes (Signed)
Pt refusing VS at this time.

## 2023-05-16 NOTE — ED Notes (Signed)
Pt attempted to close blinds on door and shut door. Explained to pt that he cannot do that.

## 2023-05-16 NOTE — ED Notes (Signed)
Notified sitter that UDS is needed

## 2023-05-16 NOTE — ED Notes (Signed)
Patient refused TTS turned off camera and ignored interviewer

## 2023-05-16 NOTE — Progress Notes (Signed)
LCSW Progress Note  [id number]   Mondale Dutcher  05/16/2023  12:41 PM  Description:   Inpatient Psychiatric Referral  Patient was recommended inpatient per Phebe Colla, NP. There are no available beds at [facility], per [facility], RN. Patient was referred to the following out of network facilities:   Destination  Service Provider Address Phone Fax  [facility]  [address], [place] [address] [phone]  [facility] [address]  [address], [place] [address] [phone]  [facility] [address] [place]  [address] [place], [place] [address] [phone]  [facility]  [address] [place], [place] [address] [phone]  [facility]  [address] [place], [place] [address] [phone]  [facility]  [address] [place] [address] [phone]  [facility]  [address], [place] [address] [phone]  [facility]  [address], [place] [address] [phone]  [facility]  [address], [place] [address] [phone]  [facility]  [place] [phone]  [facility] EFAX  [address] [place], [place] [phone]  [facility]  [address] [place] [address] [phone]  [facility]  [address], [place] [address] [phone]  [facility]  [address] [place], [place] [address] [phone]  [facility]  [address], [place] [address] [phone]  [facility] [place]  [place]  [phone]  [facility]  [address], [place] [address] [phone]  [facility]  [address] [place] [address] [phone]  [facility]  [address] [place] [address] [phone]    Situation ongoing, CSW to continue following and update chart as more information becomes available.      Cathie Beams, [place]  05/16/2023 12:41 PM

## 2023-05-16 NOTE — ED Notes (Signed)
ETA 5pm  [place] Recalled

## 2023-05-16 NOTE — ED Notes (Signed)
Pt ambulated out w/ Sheriffs w/ steady gait. VSS upon departure. Sheriffs, off-duty [employer] and this RN had to talk to pt to reassure him d/t pt anxious and delusional at time of departure. Pt responded well to conversations and left unit with a smile on his face, calm and cooperative. All belongings and paperwork given to officers.

## 2023-05-16 NOTE — ED Notes (Signed)
Pt talking w/ sitter having a linear conversation. Pt w/ minimal intermittent lucidity.

## 2023-05-16 NOTE — ED Notes (Signed)
Pt pacing in room and looking out of the door w/ inappropriate laughter and responding to internal stimuli

## 2023-05-16 NOTE — ED Notes (Signed)
ETA  1 hour for pt pick, called [phone]

## 2023-05-16 NOTE — ED Notes (Signed)
Per nightshift RN, meds for behavior not given and still available to give to pt. Per report pt attempted to elope and was stopped by off-duty [employer] and security.  Pt requiring frequent reminders to stay in his room.

## 2023-05-16 NOTE — ED Notes (Signed)
Pt asking for his "sheet with my picture on it." Pt states, "I don't have insurance and I have meds. I just need a ride home." Explained to pt that psychiatry has to see him and assess him first for them to decide what the plan is. Pt then said, "Are we good?" This RN said yes.

## 2023-05-16 NOTE — Progress Notes (Signed)
Pt has been accepted to [facility] TODAY 05/16/2023. Bed assignment: Main campus  Pt meets inpatient criteria per Phebe Colla, NP  Attending Physician will be Loni Beckwith, MD  Report can be called to: [phone] (this is a pager, please leave call-back number when giving report)  Pt can arrive anytime today  Care Team Notified: Denton Ar, RN and Phebe Colla, NP  Cathie Beams, LCSW  05/16/2023 1:18 PM

## 2023-05-16 NOTE — ED Notes (Signed)
Pt refused PO meds. Pt said that this RN was just trying to make him stay. 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 Please exit my room." Security went in to try to talk pt into taking meds for the RN and pt still refused and was talking non-logically.

## 2023-05-16 NOTE — ED Notes (Signed)
Called [place] and left a message on secure pager line for staff to call me back for report.

## 2023-05-16 NOTE — ED Notes (Signed)
Pt keeps closing his door. RN informing pt multiple times that he cannot close the door. 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
